# Patient Record
Sex: Male | Born: 1975 | Hispanic: Yes | Marital: Single | Smoking: Never smoker
Health system: Southern US, Community
[De-identification: ages and names within clinical notes are randomized; demographics above are authoritative.]

## PROBLEM LIST (undated history)

## (undated) DIAGNOSIS — R519 Headache, unspecified: Secondary | ICD-10-CM

## (undated) DIAGNOSIS — J449 Chronic obstructive pulmonary disease, unspecified: Secondary | ICD-10-CM

## (undated) DIAGNOSIS — D649 Anemia, unspecified: Secondary | ICD-10-CM

## (undated) DIAGNOSIS — T8859XA Other complications of anesthesia, initial encounter: Secondary | ICD-10-CM

## (undated) DIAGNOSIS — R0600 Dyspnea, unspecified: Secondary | ICD-10-CM

## (undated) DIAGNOSIS — I493 Ventricular premature depolarization: Secondary | ICD-10-CM

## (undated) DIAGNOSIS — J45909 Unspecified asthma, uncomplicated: Secondary | ICD-10-CM

## (undated) DIAGNOSIS — M1990 Unspecified osteoarthritis, unspecified site: Secondary | ICD-10-CM

## (undated) DIAGNOSIS — I499 Cardiac arrhythmia, unspecified: Secondary | ICD-10-CM

## (undated) DIAGNOSIS — I10 Essential (primary) hypertension: Secondary | ICD-10-CM

## (undated) HISTORY — DX: Cardiac arrhythmia, unspecified: I49.9

## (undated) HISTORY — PX: WISDOM TOOTH EXTRACTION: SHX21

## (undated) HISTORY — PX: ABLATION: SHX5711

## (undated) HISTORY — PX: SHOULDER SURGERY: SHX246

---

## 2018-07-11 ENCOUNTER — Other Ambulatory Visit: Payer: Self-pay

## 2018-07-11 ENCOUNTER — Emergency Department: Payer: BLUE CROSS/BLUE SHIELD

## 2018-07-11 ENCOUNTER — Emergency Department
Admission: EM | Admit: 2018-07-11 | Discharge: 2018-07-11 | Disposition: A | Discharge status: Home / Self Care | Payer: BLUE CROSS/BLUE SHIELD | Attending: Emergency Medicine | Admitting: Emergency Medicine

## 2018-07-11 DIAGNOSIS — W228XXA Striking against or struck by other objects, initial encounter: Secondary | ICD-10-CM | POA: Insufficient documentation

## 2018-07-11 DIAGNOSIS — S0990XA Unspecified injury of head, initial encounter: Secondary | ICD-10-CM | POA: Diagnosis present

## 2018-07-11 DIAGNOSIS — Y92009 Unspecified place in unspecified non-institutional (private) residence as the place of occurrence of the external cause: Secondary | ICD-10-CM | POA: Diagnosis not present

## 2018-07-11 DIAGNOSIS — Z79899 Other long term (current) drug therapy: Secondary | ICD-10-CM | POA: Insufficient documentation

## 2018-07-11 DIAGNOSIS — I10 Essential (primary) hypertension: Secondary | ICD-10-CM | POA: Insufficient documentation

## 2018-07-11 DIAGNOSIS — Y9389 Activity, other specified: Secondary | ICD-10-CM | POA: Diagnosis not present

## 2018-07-11 DIAGNOSIS — Y998 Other external cause status: Secondary | ICD-10-CM | POA: Insufficient documentation

## 2018-07-11 DIAGNOSIS — S0003XA Contusion of scalp, initial encounter: Secondary | ICD-10-CM

## 2018-07-11 DIAGNOSIS — S0103XA Puncture wound without foreign body of scalp, initial encounter: Secondary | ICD-10-CM | POA: Insufficient documentation

## 2018-07-11 HISTORY — DX: Essential (primary) hypertension: I10

## 2018-07-11 MED ORDER — CEPHALEXIN 500 MG PO CAPS: 500.0000 mg | ORAL_CAPSULE | Freq: Three times a day (TID) | ORAL | 0 refills | Status: DC

## 2018-07-11 MED ORDER — ETODOLAC 400 MG PO TABS: 400.0000 mg | ORAL_TABLET | Freq: Two times a day (BID) | ORAL | 0 refills | Status: DC

## 2018-07-11 NOTE — ED Provider Notes (Signed)
[facility] Emergency Department Provider Note  ____________________________________________   First MD Initiated Contact with Patient 07/11/18 1313     (approximate)  I have reviewed the triage vital signs and the nursing notes.   HISTORY  Chief Complaint Head Injury   HPI Steven Hughes is a 42 y.o. male presents to the emergency department with complaint of left-sided head pain, left neck and jaw pain.  Patient states that 2 weeks ago he hit his head on 2 wooden headers while working at his home.  He denies any loss of consciousness, nausea or vomiting.  He states that 1 of the Hatters had a nail that was sticking out which punctured his scalp.  He is concerned that this is not healed and is still tender to touch.  He is unaware of any drainage from the area.  He also states that his last tetanus was April 2019.  Patient denies any visual changes.  He takes Aleve infrequently for his head pain.  He rates his pain as 6 out of 10.   Past Medical History:  Diagnosis Date  . Hypertension     There are no active problems to display for this patient.   History reviewed. No pertinent surgical history.  Prior to Admission medications   Medication Sig Start Date End Date Taking? Authorizing Provider  metoprolol tartrate (LOPRESSOR) 25 MG tablet Take 25 mg by mouth 2 (two) times daily.   Yes [provider]  tadalafil (CIALIS) 5 MG tablet Take 5 mg by mouth daily as needed for erectile dysfunction.   Yes [provider]  cephALEXin (KEFLEX) 500 MG capsule Take 1 capsule (500 mg total) by mouth 3 (three) times daily. 07/11/18   Tommi Rumps, PA-C  etodolac (LODINE) 400 MG tablet Take 1 tablet (400 mg total) by mouth 2 (two) times daily. 07/11/18   Tommi Rumps, PA-C    Allergies Cucumber extract; Gluten meal; Morphine and related; Other; Strawberry (diagnostic); and Watermelon [citrullus vulgaris]  No family history on  file.  Social History Social History   Tobacco Use  . Smoking status: Never Smoker  Substance Use Topics  . Alcohol use: Not Currently  . Drug use: Not on file    Review of Systems Constitutional: No fever/chills Eyes: No visual changes. ENT: No sore throat.  Negative for dental pain.  Positive for left sided jaw pain. Cardiovascular: Denies chest pain. Respiratory: Denies shortness of breath. Gastrointestinal: No abdominal pain.  No nausea, no vomiting.  Musculoskeletal: Negative for back pain.  Positive for left-sided cervical muscle pain. Skin: Negative for rash.  Positive for puncture wound left scalp. Neurological: Negative for headaches, focal weakness or numbness. ____________________________________________   PHYSICAL EXAM:  VITAL SIGNS: ED Triage Vitals  Enc Vitals Group     BP 07/11/18 1138 128/81     Pulse Rate 07/11/18 1138 69     Resp 07/11/18 1138 18     Temp 07/11/18 1138 98.2 F (36.8 C)     Temp Source 07/11/18 1138 Oral     SpO2 07/11/18 1138 100 %     Weight 07/11/18 1139 163 lb (73.9 kg)     Height 07/11/18 1139 5\' 10"  (1.778 m)     Head Circumference --      Peak Flow --      Pain Score 07/11/18 1139 6     Pain Loc --      Pain Edu? --      Excl. in  GC? --    Constitutional: Alert and oriented. Well appearing and in no acute distress. Eyes: Conjunctivae are normal. PERRL. EOMI. Head: Atraumatic. Nose: No trauma. Mouth/Throat: No gross deformity and no soft tissue injury is noted to the left jaw.  There is some minimal point tenderness on palpation.  Teeth fit normally and no dental injury noted. Neck: No stridor.  No tenderness on palpation of cervical spine posteriorly.  There is some minimal soft tissue cervical muscle tenderness to palpation on the left lateral aspect.  Range of motion is without restriction. Hematological/Lymphatic/Immunilogical: No cervical lymphadenopathy. Cardiovascular: Normal rate, regular rhythm. Grossly normal heart  sounds.  Good peripheral circulation. Respiratory: Normal respiratory effort.  No retractions. Lungs CTAB. Gastrointestinal: Soft and nontender. No distention. Musculoskeletal: Moves upper and lower extremities that any difficulty.  Normal gait was noted. Neurologic:  Normal speech and language. No gross focal neurologic deficits are appreciated. Skin:  Skin is warm, dry.  There is a single puncture wound on the left lateral scalp without active bleeding.  Area is tender to touch.  No drainage is noted. Psychiatric: Mood and affect are normal. Speech and behavior are normal.  ____________________________________________   LABS (all labs ordered are listed, but only abnormal results are displayed)  Labs Reviewed - No data to display  RADIOLOGY  Official radiology report(s): Ct Head Wo Contrast  Result Date: 07/11/2018 CLINICAL DATA:  Struck in head 2 weeks ago. Persistent headache, left jaw and face pain. EXAM: CT HEAD WITHOUT CONTRAST CT MAXILLOFACIAL WITHOUT CONTRAST TECHNIQUE: Multidetector CT imaging of the head and maxillofacial structures were performed using the standard protocol without intravenous contrast. Multiplanar CT image reconstructions of the maxillofacial structures were also generated. COMPARISON:  None. FINDINGS: CT HEAD FINDINGS Brain: The ventricles are normal in size and configuration. No extra-axial fluid collections are identified. The gray-white differentiation is maintained. No CT findings for acute hemispheric infarction or intracranial hemorrhage. No mass lesions. The brainstem and cerebellum are normal. Vascular: No hyperdense vessels or obvious aneurysm. Skull: No acute skull fracture.  No bone lesion. Sinuses/Orbits: The paranasal sinuses and mastoid air cells are clear. The globes are intact. Other: No scalp lesions, laceration or hematoma. CT MAXILLOFACIAL FINDINGS Osseous: No fracture or mandibular dislocation. No destructive process. Orbits: Negative. No  traumatic or inflammatory finding. Sinuses: No acute sinus disease. Minimal scattered areas of mucoperiosteal thickening and a few small scattered polyps or mucous retention cyst. Soft tissues: No hematoma, laceration or foreign body. IMPRESSION: 1. Normal head CT. No acute intracranial findings or skull fracture. 2. No acute facial bone fractures. Electronically Signed   By: Rudie MeyerP.  Gallerani M.D.   On: 07/11/2018 12:28   Ct Maxillofacial Wo Contrast  Result Date: 07/11/2018 CLINICAL DATA:  Struck in head 2 weeks ago. Persistent headache, left jaw and face pain. EXAM: CT HEAD WITHOUT CONTRAST CT MAXILLOFACIAL WITHOUT CONTRAST TECHNIQUE: Multidetector CT imaging of the head and maxillofacial structures were performed using the standard protocol without intravenous contrast. Multiplanar CT image reconstructions of the maxillofacial structures were also generated. COMPARISON:  None. FINDINGS: CT HEAD FINDINGS Brain: The ventricles are normal in size and configuration. No extra-axial fluid collections are identified. The gray-white differentiation is maintained. No CT findings for acute hemispheric infarction or intracranial hemorrhage. No mass lesions. The brainstem and cerebellum are normal. Vascular: No hyperdense vessels or obvious aneurysm. Skull: No acute skull fracture.  No bone lesion. Sinuses/Orbits: The paranasal sinuses and mastoid air cells are clear. The globes are intact. Other: No scalp  lesions, laceration or hematoma. CT MAXILLOFACIAL FINDINGS Osseous: No fracture or mandibular dislocation. No destructive process. Orbits: Negative. No traumatic or inflammatory finding. Sinuses: No acute sinus disease. Minimal scattered areas of mucoperiosteal thickening and a few small scattered polyps or mucous retention cyst. Soft tissues: No hematoma, laceration or foreign body. IMPRESSION: 1. Normal head CT. No acute intracranial findings or skull fracture. 2. No acute facial bone fractures. Electronically Signed    By: Rudie Meyer M.D.   On: 07/11/2018 12:28   ____________________________________________   PROCEDURES  Procedure(s) performed: None  Procedures  Critical Care performed: No  ____________________________________________   INITIAL IMPRESSION / ASSESSMENT AND PLAN / ED COURSE  As part of my medical decision making, I reviewed the following data within the electronic MEDICAL RECORD NUMBER Notes from prior ED visits and [place] Controlled Substance Database  Patient presents to the ED with complaint of puncture wound to his left scalp after he hit a wooden head or with his head approximately 2 weeks ago.  Patient states he is up-to-date on tetanus.  He has continued to have headache, left facial pain and cervical muscle pain.  Patient has infrequently taken Aleve with out relief of his pain.  Patient is unaware of any drainage from his puncture wound.  Physical exam is consistent with a cervical muscle strain along with contusion of his scalp with a single puncture wound.  CT scan of head and maxillofacial bones were negative and reassuring to the patient.  He will begin taking Keflex 500 mg 3 times daily and etodolac 400 mg twice daily with food.  He is to follow-up with his PCP or [facility] acute care if any continued problems.  He is encouraged to clean the puncture wound daily with mild soap and water and to return if any severe worsening of his symptoms.  ____________________________________________   FINAL CLINICAL IMPRESSION(S) / ED DIAGNOSES  Final diagnoses:  Contusion of scalp, initial encounter  Puncture wound of scalp without foreign body, initial encounter     ED Discharge Orders         Ordered    cephALEXin (KEFLEX) 500 MG capsule  3 times daily     07/11/18 1412    etodolac (LODINE) 400 MG tablet  2 times daily     07/11/18 1412           Note:  This document was prepared using Dragon voice recognition software and may include unintentional dictation  errors.    Tommi Rumps, PA-C 07/11/18 1442    Governor Rooks, MD 07/11/18 [phone]

## 2018-07-11 NOTE — ED Notes (Signed)
See triage note  Presents with head pain  States he had a wooden header fall on  his head about 2 weeks ago  States he board had a nail in it  conts to have pain to area

## 2018-07-11 NOTE — ED Triage Notes (Addendum)
Pt states that he was hit in head with two wooden headers approx 2 week ago while working at his home. States that he has had continued pain to left side of head, left neck muscle,  left jaw and left side of face since. Does report LOC and puncture wound in head from nail. tetanus shot last received in April.  Pt alert and oriented X4, active, cooperative, pt in NAD. RR even and unlabored, color WNL.  No fevers at home reported.

## 2018-07-11 NOTE — Discharge Instructions (Signed)
Follow-up with your primary care provider or [facility] acute care if any continued problems.  Clean area with mild soap and water daily.  You may also put warm compresses to the area.  Begin taking etodolac 400 mg twice daily with food and Keflex 500 mg 3 times daily for 7 days.  You may also take Tylenol with this medication if needed for pain.

## 2020-01-28 ENCOUNTER — Other Ambulatory Visit: Payer: Self-pay | Admitting: Gastroenterology

## 2020-01-28 DIAGNOSIS — R1011 Right upper quadrant pain: Secondary | ICD-10-CM

## 2020-02-07 ENCOUNTER — Encounter
Admission: RE | Admit: 2020-02-07 | Discharge: 2020-02-07 | Disposition: A | Discharge status: Home / Self Care | Payer: BC Managed Care – PPO | Source: Ambulatory Visit | Attending: Gastroenterology | Admitting: Gastroenterology

## 2020-02-07 ENCOUNTER — Other Ambulatory Visit: Payer: Self-pay

## 2020-02-07 DIAGNOSIS — R1011 Right upper quadrant pain: Secondary | ICD-10-CM

## 2020-02-07 MED ORDER — TECHNETIUM TC 99M MEBROFENIN IV KIT
5.2870 | PACK | Freq: Once | INTRAVENOUS | Status: AC | PRN
  Administered 2020-02-07: 08:00:00 5.287 via INTRAVENOUS

## 2020-06-18 ENCOUNTER — Encounter: Payer: Self-pay | Admitting: Internal Medicine

## 2020-06-26 ENCOUNTER — Ambulatory Visit (INDEPENDENT_AMBULATORY_CARE_PROVIDER_SITE_OTHER): Payer: BC Managed Care – PPO | Admitting: Surgery

## 2020-06-26 ENCOUNTER — Encounter: Payer: Self-pay | Admitting: Surgery

## 2020-06-26 ENCOUNTER — Other Ambulatory Visit: Payer: Self-pay

## 2020-06-26 VITALS — BP 131/86 | HR 66 | Temp 98.3°F | Ht 70.0 in | Wt 196.0 lb

## 2020-06-26 DIAGNOSIS — R1011 Right upper quadrant pain: Secondary | ICD-10-CM | POA: Diagnosis not present

## 2020-06-26 NOTE — H&P (View-Only) (Signed)
Patient ID: Steven Hughes, male   DOB: 10/24/1976, 43 y.o.   MRN: [id number]  Chief Complaint: Right upper quadrant pain.  History of Present Illness Steven Hughes is a 43 y.o. male with right upper quadrant pain with radiation to right scapula.  Exacerbated postprandial, but also exacerbated with far less like water.  Typically short lived.  History of heartburn.  Denies constipation moving 1-3 times per day.  Had prior work-up which included an ultrasound in April of this year which was negative, HIDA scan previously showed an ejection fraction 98% and based on the patient's report did not provoke any of the symptoms he presents for.  He has a diastases recti and has some rectus strain/pain with activity/weight lifting.  He does have a small umbilical hernia he knows about but does not create/cause significant pain for him however he notes it is tender.  Past Medical History Past Medical History:  Diagnosis Date  . Arrhythmia   . Hypertension       Past Surgical History:  Procedure Laterality Date  . ABLATION    . SHOULDER SURGERY Bilateral     Allergies  Allergen Reactions  . Cucumber Extract   . Gluten Meal   . Morphine And Related   . Other     Cilantro  . Strawberry (Diagnostic)   . Strawberry Extract Other (See Comments)  . Watermelon [Citrullus Vulgaris]     Current Outpatient Medications  Medication Sig Dispense Refill  . albuterol (VENTOLIN HFA) 108 (90 Base) MCG/ACT inhaler     . fluticasone (FLONASE) 50 MCG/ACT nasal spray     . hydrOXYzine (VISTARIL) 25 MG capsule     . metoprolol tartrate (LOPRESSOR) 25 MG tablet Take 25 mg by mouth 2 (two) times daily.    . Tadalafil 2.5 MG TABS Take 1 tablet by mouth daily.    . cephALEXin (KEFLEX) 500 MG capsule Take 1 capsule (500 mg total) by mouth 3 (three) times daily. (Patient not taking: Reported on 06/26/2020) 21 capsule 0  . etodolac (LODINE) 400 MG tablet Take 1 tablet (400 mg total) by mouth 2 (two) times daily.  (Patient not taking: Reported on 06/26/2020) 14 tablet 0  . pantoprazole (PROTONIX) 40 MG tablet Take by mouth. (Patient not taking: Reported on 06/26/2020)    . sucralfate (CARAFATE) 1 g tablet TAKE 1 TABLET (1 G TOTAL) BY MOUTH 2 (TWO) TIMES DAILY BEFORE A MEAL (Patient not taking: Reported on 06/26/2020)    . tadalafil (CIALIS) 5 MG tablet Take 5 mg by mouth daily as needed for erectile dysfunction. (Patient not taking: Reported on 06/26/2020)     No current facility-administered medications for this visit.    Family History Family History  Problem Relation Age of Onset  . Hepatitis C Mother   . Healthy Mother   . Parkinson's disease Father   . Healthy Father   . Hypertension Sister   . Hypertension Paternal Grandmother   . Hypertension Paternal Grandfather       Social History Social History   Tobacco Use  . Smoking status: Never Smoker  . Smokeless tobacco: Never Used  Substance Use Topics  . Alcohol use: Not Currently  . Drug use: Not on file        Review of Systems  Constitutional: Negative for chills, diaphoresis, fever, malaise/fatigue and weight loss.  HENT: Negative.   Eyes: Positive for blurred vision.  Respiratory: Positive for shortness of breath.   Cardiovascular: Positive for chest pain and palpitations.    Gastrointestinal: Positive for abdominal pain and heartburn. Negative for blood in stool, constipation, diarrhea, melena, nausea and vomiting.  Genitourinary: Positive for dysuria and frequency.  Skin: Negative.   Neurological: Positive for headaches.  Psychiatric/Behavioral: Negative.       Physical Exam Blood pressure 131/86, pulse 66, temperature 98.3 F (36.8 C), temperature source Oral, height 5\' 10"  (1.778 m), weight 196 lb (88.9 kg), SpO2 96 %. Last Weight  Most recent update: 06/26/2020  9:36 AM   Weight  88.9 kg (196 lb)            CONSTITUTIONAL: Well developed, and nourished, appropriately responsive and aware without distress.   EYES:  Sclera non-icteric.   EARS, NOSE, MOUTH AND THROAT: Mask worn.   Hearing is intact to voice.  NECK: Trachea is midline, and there is no jugular venous distension.  LYMPH NODES:  Lymph nodes in the neck are not enlarged. RESPIRATORY:  Lungs are clear, and breath sounds are equal bilaterally. Normal respiratory effort without pathologic use of accessory muscles. CARDIOVASCULAR: Heart is regular in rate and rhythm. GI: The abdomen is soft, nontender, and nondistended. There were no palpable masses. I did not appreciate hepatosplenomegaly. There were normal bowel sounds.  He has mild to moderate diastases recti, with a subcentimeter incarcerated umbilical hernia which is tender with attempts to reduce. MUSCULOSKELETAL:  Symmetrical muscle tone appreciated in all four extremities.    SKIN: Skin turgor is normal. No pathologic skin lesions appreciated.  NEUROLOGIC:  Motor and sensation appear grossly normal.  Cranial nerves are grossly without defect. PSYCH:  Alert and oriented to person, place and time. Affect is appropriate for situation.  Data Reviewed I have personally reviewed what is currently available of the patient's imaging, recent labs and medical records.   Labs:  No flowsheet data found. No flowsheet data found.    Imaging: Radiology review: Reviewed the reports from prior abdominal ultrasound and HIDA scan. Within last 24 hrs: No results found.  Assessment    Right upper quadrant pain, doubt cholecystectomy will be helpful despite a classical presentation without fatty food intolerance.  And in light of his evaluation thus far has been negative.  He is already seen GI and upper endoscopy apparently has revealed nothing.  It is clearly not costochondritis. There are no problems to display for this patient.   Plan    I have offered a CT scan evaluation to go a little further.  He is not interested in any colonoscopy to evaluate potential colonic source.  Offered repair of the  small umbilical hernia, which we will proceed with after the CT scan shows no other explanation for his pain. Risk of the umbilical hernia repair discussed with the patient which include but are not limited to anesthesia, bleeding, infection, recurrence.  I made it very clear that this will not specifically address his presenting complaint.  I have had a rare patient in my history that has reportedly improved with cholecystectomy in this scenario, at present I do not believe I am justified at pursuing cholecystectomy for him.  I believe he understands this and desires to follow through with a CT and proceed to have his umbilical defect repaired.  Face-to-face time spent with the patient and accompanying care providers(if present) was 45 minutes, with more than 50% of the time spent counseling, educating, and coordinating care of the patient.      08/26/2020 M.D., FACS 06/26/2020, 3:38 PM

## 2020-06-26 NOTE — Patient Instructions (Addendum)
Patient will have CT Abdomen Pelvis with Contrast at [facility] on 07/09/20.  Patient will arrive at 3:15p for 3:30p scan. Patient instructions: patient will pick up the prep kit.  Patient is NOT to have anything to eat or drink 4 hours prior to CT scan.  Abdominal Pain, Adult Many things can cause belly (abdominal) pain. Most times, belly pain is not dangerous. Many cases of belly pain can be watched and treated at home. Sometimes, though, belly pain is serious. Your doctor will try to find the cause of your belly pain. Follow these instructions at home:  Medicines  Take over-the-counter and prescription medicines only as told by your doctor.  Do not take medicines that help you poop (laxatives) unless told by your doctor. General instructions  Watch your belly pain for any changes.  Drink enough fluid to keep your pee (urine) pale yellow.  Keep all follow-up visits as told by your doctor. This is important. Contact a doctor if:  Your belly pain changes or gets worse.  You are not hungry, or you lose weight without trying.  You are having trouble pooping (constipated) or have watery poop (diarrhea) for more than 2-3 days.  You have pain when you pee or poop.  Your belly pain wakes you up at night.  Your pain gets worse with meals, after eating, or with certain foods.  You are vomiting and cannot keep anything down.  You have a fever.  You have blood in your pee. Get help right away if:  Your pain does not go away as soon as your doctor says it should.  You cannot stop vomiting.  Your pain is only in areas of your belly, such as the right side or the left lower part of the belly.  You have bloody or black poop, or poop that looks like tar.  You have very bad pain, cramping, or bloating in your belly.  You have signs of not having enough fluid or water in your body (dehydration), such as: ? Dark pee, very little pee, or no pee. ? Cracked  lips. ? Dry mouth. ? Sunken eyes. ? Sleepiness. ? Weakness.  You have trouble breathing or chest pain. Summary  Many cases of belly pain can be watched and treated at home.  Watch your belly pain for any changes.  Take over-the-counter and prescription medicines only as told by your doctor.  Contact a doctor if your belly pain changes or gets worse.  Get help right away if you have very bad pain, cramping, or bloating in your belly. This information is not intended to replace advice given to you by your health care provider. Make sure you discuss any questions you have with your health care provider. Document Revised: 02/19/2019 Document Reviewed: 02/19/2019 Elsevier Patient Education  [place].

## 2020-06-26 NOTE — Progress Notes (Signed)
Patient ID: Steven Hughes, male   DOB: 19-Mar-1976, 44 y.o.   MRN: [id number]  Chief Complaint: Right upper quadrant pain.  History of Present Illness Steven Hughes is a 44 y.o. male with right upper quadrant pain with radiation to right scapula.  Exacerbated postprandial, but also exacerbated with far less like water.  Typically short lived.  History of heartburn.  Denies constipation moving 1-3 times per day.  Had prior work-up which included an ultrasound in April of this year which was negative, HIDA scan previously showed an ejection fraction 98% and based on the patient's report did not provoke any of the symptoms he presents for.  He has a diastases recti and has some rectus strain/pain with activity/weight lifting.  He does have a small umbilical hernia he knows about but does not create/cause significant pain for him however he notes it is tender.  Past Medical History Past Medical History:  Diagnosis Date  . Arrhythmia   . Hypertension       Past Surgical History:  Procedure Laterality Date  . ABLATION    . SHOULDER SURGERY Bilateral     Allergies  Allergen Reactions  . Cucumber Extract   . Gluten Meal   . Morphine And Related   . Other     Cilantro  . Strawberry (Diagnostic)   . Strawberry Extract Other (See Comments)  . Watermelon [Citrullus Vulgaris]     Current Outpatient Medications  Medication Sig Dispense Refill  . albuterol (VENTOLIN HFA) 108 (90 Base) MCG/ACT inhaler     . fluticasone (FLONASE) 50 MCG/ACT nasal spray     . hydrOXYzine (VISTARIL) 25 MG capsule     . metoprolol tartrate (LOPRESSOR) 25 MG tablet Take 25 mg by mouth 2 (two) times daily.    . Tadalafil 2.5 MG TABS Take 1 tablet by mouth daily.    . cephALEXin (KEFLEX) 500 MG capsule Take 1 capsule (500 mg total) by mouth 3 (three) times daily. (Patient not taking: Reported on 06/26/2020) 21 capsule 0  . etodolac (LODINE) 400 MG tablet Take 1 tablet (400 mg total) by mouth 2 (two) times daily.  (Patient not taking: Reported on 06/26/2020) 14 tablet 0  . pantoprazole (PROTONIX) 40 MG tablet Take by mouth. (Patient not taking: Reported on 06/26/2020)    . sucralfate (CARAFATE) 1 g tablet TAKE 1 TABLET (1 G TOTAL) BY MOUTH 2 (TWO) TIMES DAILY BEFORE A MEAL (Patient not taking: Reported on 06/26/2020)    . tadalafil (CIALIS) 5 MG tablet Take 5 mg by mouth daily as needed for erectile dysfunction. (Patient not taking: Reported on 06/26/2020)     No current facility-administered medications for this visit.    Family History Family History  Problem Relation Age of Onset  . Hepatitis C Mother   . Healthy Mother   . Parkinson's disease Father   . Healthy Father   . Hypertension Sister   . Hypertension Paternal Grandmother   . Hypertension Paternal Grandfather       Social History Social History   Tobacco Use  . Smoking status: Never Smoker  . Smokeless tobacco: Never Used  Substance Use Topics  . Alcohol use: Not Currently  . Drug use: Not on file        Review of Systems  Constitutional: Negative for chills, diaphoresis, fever, malaise/fatigue and weight loss.  HENT: Negative.   Eyes: Positive for blurred vision.  Respiratory: Positive for shortness of breath.   Cardiovascular: Positive for chest pain and palpitations.  Gastrointestinal: Positive for abdominal pain and heartburn. Negative for blood in stool, constipation, diarrhea, melena, nausea and vomiting.  Genitourinary: Positive for dysuria and frequency.  Skin: Negative.   Neurological: Positive for headaches.  Psychiatric/Behavioral: Negative.       Physical Exam Blood pressure 131/86, pulse 66, temperature 98.3 F (36.8 C), temperature source Oral, height 5\' 10"  (1.778 m), weight 196 lb (88.9 kg), SpO2 96 %. Last Weight  Most recent update: 06/26/2020  9:36 AM   Weight  88.9 kg (196 lb)            CONSTITUTIONAL: Well developed, and nourished, appropriately responsive and aware without distress.   EYES:  Sclera non-icteric.   EARS, NOSE, MOUTH AND THROAT: Mask worn.   Hearing is intact to voice.  NECK: Trachea is midline, and there is no jugular venous distension.  LYMPH NODES:  Lymph nodes in the neck are not enlarged. RESPIRATORY:  Lungs are clear, and breath sounds are equal bilaterally. Normal respiratory effort without pathologic use of accessory muscles. CARDIOVASCULAR: Heart is regular in rate and rhythm. GI: The abdomen is soft, nontender, and nondistended. There were no palpable masses. I did not appreciate hepatosplenomegaly. There were normal bowel sounds.  He has mild to moderate diastases recti, with a subcentimeter incarcerated umbilical hernia which is tender with attempts to reduce. MUSCULOSKELETAL:  Symmetrical muscle tone appreciated in all four extremities.    SKIN: Skin turgor is normal. No pathologic skin lesions appreciated.  NEUROLOGIC:  Motor and sensation appear grossly normal.  Cranial nerves are grossly without defect. PSYCH:  Alert and oriented to person, place and time. Affect is appropriate for situation.  Data Reviewed I have personally reviewed what is currently available of the patient's imaging, recent labs and medical records.   Labs:  No flowsheet data found. No flowsheet data found.    Imaging: Radiology review: Reviewed the reports from prior abdominal ultrasound and HIDA scan. Within last 24 hrs: No results found.  Assessment    Right upper quadrant pain, doubt cholecystectomy will be helpful despite a classical presentation without fatty food intolerance.  And in light of his evaluation thus far has been negative.  He is already seen GI and upper endoscopy apparently has revealed nothing.  It is clearly not costochondritis. There are no problems to display for this patient.   Plan    I have offered a CT scan evaluation to go a little further.  He is not interested in any colonoscopy to evaluate potential colonic source.  Offered repair of the  small umbilical hernia, which we will proceed with after the CT scan shows no other explanation for his pain. Risk of the umbilical hernia repair discussed with the patient which include but are not limited to anesthesia, bleeding, infection, recurrence.  I made it very clear that this will not specifically address his presenting complaint.  I have had a rare patient in my history that has reportedly improved with cholecystectomy in this scenario, at present I do not believe I am justified at pursuing cholecystectomy for him.  I believe he understands this and desires to follow through with a CT and proceed to have his umbilical defect repaired.  Face-to-face time spent with the patient and accompanying care providers(if present) was 45 minutes, with more than 50% of the time spent counseling, educating, and coordinating care of the patient.      08/26/2020 M.D., FACS 06/26/2020, 3:38 PM

## 2020-06-27 ENCOUNTER — Telehealth: Payer: Self-pay | Admitting: Surgery

## 2020-06-27 NOTE — Telephone Encounter (Signed)
Pt has been advised of Pre-Admission date/time, COVID Testing date and Surgery date, also mailed.  Surgery Date: 07/18/20 Preadmission Testing Date: 07/04/20 (phone 1p-5p) Covid Testing Date: 07/16/20 - patient [facility] to [facility] ([address] [facility]) between 8a-1p   Patient has been made aware to call [phone], between 1-3:00pm the day before surgery, to find out what time to arrive for surgery.

## 2020-07-03 ENCOUNTER — Ambulatory Visit: Payer: Self-pay | Admitting: Surgery

## 2020-07-03 DIAGNOSIS — K429 Umbilical hernia without obstruction or gangrene: Secondary | ICD-10-CM

## 2020-07-04 ENCOUNTER — Encounter
Admission: RE | Admit: 2020-07-04 | Discharge: 2020-07-04 | Disposition: A | Discharge status: Home / Self Care | Payer: BC Managed Care – PPO | Source: Ambulatory Visit | Attending: Surgery | Admitting: Surgery

## 2020-07-04 ENCOUNTER — Other Ambulatory Visit: Payer: Self-pay

## 2020-07-04 DIAGNOSIS — Z01818 Encounter for other preprocedural examination: Secondary | ICD-10-CM | POA: Diagnosis not present

## 2020-07-04 HISTORY — DX: Ventricular premature depolarization: I49.3

## 2020-07-04 HISTORY — DX: Unspecified osteoarthritis, unspecified site: M19.90

## 2020-07-04 HISTORY — DX: Anemia, unspecified: D64.9

## 2020-07-04 HISTORY — DX: Dyspnea, unspecified: R06.00

## 2020-07-04 HISTORY — DX: Unspecified asthma, uncomplicated: J45.909

## 2020-07-04 HISTORY — DX: Other complications of anesthesia, initial encounter: T88.59XA

## 2020-07-04 HISTORY — DX: Headache, unspecified: R51.9

## 2020-07-04 HISTORY — DX: Chronic obstructive pulmonary disease, unspecified: J44.9

## 2020-07-04 NOTE — Pre-Procedure Instructions (Signed)
ECG 12-lead  Component 6 mo ago  Vent Rate (bpm)  74     PR Interval (msec)  146     QRS Interval (msec)  100     QT Interval (msec)  360     QTc (msec)  399     Narrative  Normal sinus rhythm  Normal ECG  When compared with ECG of 14-Dec-2019 11:52,  No significant change was found  I reviewed and concur with this report. Electronically signed [email or web address], MD, Christen Bame [phone]) on 01/23/2020 7:08:27 PM Other Result Text  This result has an attachment that is not available. Specimen Collected: --  Date: 12/14/19  Received From: [facility]   Encounter Summary

## 2020-07-04 NOTE — Patient Instructions (Signed)
Your procedure is scheduled on: 07-18-20 FRIDAY Report to Same Day Surgery 2nd floor medical mall [facility] Entrance-take elevator on left to 2nd floor.  Check in with surgery information desk.) To find out your arrival time please call [phone] between 1PM - 3PM on 07-17-20 THURSDAY  Remember: Instructions that are not followed completely may result in serious medical risk, up to and including death, or upon the discretion of your surgeon and anesthesiologist your surgery may need to be rescheduled.    _x___ 1. Do not eat food after midnight the night before your procedure. NO GUM OR CANDY AFTER MIDNIGHT. You may drink clear liquids up to 2 hours before you are scheduled to arrive at the hospital for your procedure.  Do not drink clear liquids within 2 hours of your scheduled arrival to the hospital.  Clear liquids include  --Water or Apple juice without pulp  -- Gatorade  --Black Coffee or Clear Tea (No milk, no creamers, do not add anything to the coffee or Tea-OK TO ADD SUGAR     __x__ 2. No Alcohol for 24 hours before or after surgery.   __x__3. No Smoking or e-cigarettes for 24 prior to surgery.  Do not use any chewable tobacco products for at least 6 hour prior to surgery   ____  4. Bring all medications with you on the day of surgery if instructed.    __x__ 5. Notify your doctor if there is any change in your medical condition     (cold, fever, infections).    x___6. On the morning of surgery brush your teeth with toothpaste and water.  You may rinse your mouth with mouth wash if you wish.  Do not swallow any toothpaste or mouthwash.   Do not wear jewelry, make-up, hairpins, clips or nail polish.  Do not wear lotions, powders, or perfumes.   Do not shave 48 hours prior to surgery. Men may shave face and neck.  Do not bring valuables to the hospital.    [facility] is not responsible for any belongings or valuables.               Contacts, dentures or bridgework may  not be worn into surgery.  Leave your suitcase in the car. After surgery it may be brought to your room.  For patients admitted to the hospital, discharge time is determined by your treatment team.  _  Patients discharged the day of surgery will not be allowed to drive home.  You will need someone to drive you home and stay with you the night of your procedure.    Please read over the following fact sheets that you were given:   [facility] Preparing for Surgery   ____ TAKE THE FOLLOWING MEDICATION THE MORNING OF SURGERY WITH A SMALL SIP OF WATER. These include:  1. NONE  2.  3.  4.  5.  6.  ____Fleets enema or Magnesium Citrate as directed.   _x___ Use CHG Soap as directed on instruction sheet   _X___ Use inhalers on the day of surgery and bring to hospital day of surgery-USE YOUR ALBUTEROL INHALER THE MORNING OF SURGERY AND BRING INHALER WITH YOU TO THE HOSPITAL  ____ Stop Metformin and Janumet 2 days prior to surgery.    ____ Take 1/2 of usual insulin dose the night before surgery and none on the morning surgery.   ____ Follow recommendations from Cardiologist, Pulmonologist or PCP regarding stopping Aspirin, Coumadin, Plavix ,Eliquis, Effient, or Pradaxa,  and Pletal.  X____Stop Anti-inflammatories such as Advil, Aleve, Ibuprofen, Motrin, Naproxen, Naprosyn, Goodies powders or aspirin products 7 DAYS PRIOR TO SURGERY-OK to take Tylenol    _x___ Stop supplements until after surgery-STOP MELATONIN 7 DAYS PRIOR TO SURGERY-OK TO RESUME AFTER SURGERY   ____ Bring C-Pap to the hospital.

## 2020-07-07 ENCOUNTER — Other Ambulatory Visit: Payer: BC Managed Care – PPO

## 2020-07-08 ENCOUNTER — Telehealth: Payer: Self-pay | Admitting: *Deleted

## 2020-07-08 ENCOUNTER — Inpatient Hospital Stay: Admission: RE | Admit: 2020-07-08 | Payer: BC Managed Care – PPO | Source: Ambulatory Visit

## 2020-07-08 NOTE — Telephone Encounter (Signed)
Patient called and wanted to get a doctors note in regards to when he can return to work and what restrictions. He wanted a nurse to call him first in regards to this.   He is having surgery on 07/18/20 with Dr Guadalupe Dawn hernia

## 2020-07-09 ENCOUNTER — Ambulatory Visit
Admission: RE | Admit: 2020-07-09 | Discharge: 2020-07-09 | Disposition: A | Discharge status: Home / Self Care | Payer: BC Managed Care – PPO | Source: Ambulatory Visit | Attending: Surgery | Admitting: Surgery

## 2020-07-09 ENCOUNTER — Encounter: Payer: Self-pay | Admitting: Emergency Medicine

## 2020-07-09 ENCOUNTER — Other Ambulatory Visit: Payer: Self-pay

## 2020-07-09 DIAGNOSIS — R1011 Right upper quadrant pain: Secondary | ICD-10-CM | POA: Diagnosis present

## 2020-07-09 LAB — POCT I-STAT CREATININE: Creatinine, Ser: 1.3 mg/dL — ABNORMAL HIGH (ref 0.61–1.24)

## 2020-07-09 MED ORDER — IOHEXOL 300 MG/ML  SOLN
100.0000 mL | Freq: Once | INTRAMUSCULAR | Status: AC | PRN
  Administered 2020-07-09: 100 mL via INTRAVENOUS

## 2020-07-09 NOTE — Telephone Encounter (Signed)
Called patient, states he is having surgery on 9/24. Patient needs work note for 2 weeks. So returning to work on 08/04/20. Advised pt he will have restrictions of no heavy lifting 10-15 pounds until 4-6 weeks. Pt states that is fine, he works from home so it shouldn't be an issue. Pt wants letter mailed to his home. Pt confirmed address. Advised pt letter is placed at the front ready to be mailed out. No further concerns.

## 2020-07-11 ENCOUNTER — Other Ambulatory Visit: Payer: Self-pay

## 2020-07-11 ENCOUNTER — Emergency Department
Admission: EM | Admit: 2020-07-11 | Discharge: 2020-07-11 | Disposition: A | Discharge status: Home / Self Care | Payer: No Typology Code available for payment source | Attending: Emergency Medicine | Admitting: Emergency Medicine

## 2020-07-11 DIAGNOSIS — S24109A Unspecified injury at unspecified level of thoracic spinal cord, initial encounter: Secondary | ICD-10-CM | POA: Diagnosis present

## 2020-07-11 DIAGNOSIS — J45909 Unspecified asthma, uncomplicated: Secondary | ICD-10-CM | POA: Diagnosis not present

## 2020-07-11 DIAGNOSIS — Z7951 Long term (current) use of inhaled steroids: Secondary | ICD-10-CM | POA: Diagnosis not present

## 2020-07-11 DIAGNOSIS — S29012A Strain of muscle and tendon of back wall of thorax, initial encounter: Secondary | ICD-10-CM

## 2020-07-11 DIAGNOSIS — X500XXA Overexertion from strenuous movement or load, initial encounter: Secondary | ICD-10-CM | POA: Insufficient documentation

## 2020-07-11 DIAGNOSIS — Z79899 Other long term (current) drug therapy: Secondary | ICD-10-CM | POA: Insufficient documentation

## 2020-07-11 MED ORDER — CYCLOBENZAPRINE HCL 10 MG PO TABS: 5.0000 mg | ORAL_TABLET | Freq: Three times a day (TID) | ORAL | 0 refills | Status: DC | PRN

## 2020-07-11 MED ORDER — MELOXICAM 15 MG PO TABS: 15.0000 mg | ORAL_TABLET | Freq: Every day | ORAL | 0 refills | Status: DC

## 2020-07-11 NOTE — ED Notes (Signed)
See triage note  Presents with pain between shoulder blades and into left arm  States developed pain while at work    States the pain took his breath   It was hard to breath   Ambulates well

## 2020-07-11 NOTE — Discharge Instructions (Signed)
Please follow up with primary care for symptoms not improving over the week.  Return to the ER for symptoms that change or worsen if unable to schedule an appointment.

## 2020-07-11 NOTE — ED Provider Notes (Signed)
[facility] Emergency Department Provider Note ____________________________________________  Time seen: Approximately 3:19 PM  I have reviewed the triage vital signs and the nursing notes.  HISTORY  Chief Complaint Back Pain and Shoulder Pain   HPI Steven Hughes is a 44 y.o. male who presents to the emergency department for treatment and evaluation of upper back pain after picking up something heavy while at work today. Pain is between his shoulder blades and initially was severe. Some relief after ibuprofen.  Past Medical History:  Diagnosis Date  . Anemia    ONLY AS A CHILD  . Arrhythmia   . Arthritis   . Asthma   . Complication of anesthesia    DURING DENTAL PROCEDURES NOVACAINE CAUSES MORE PVC'S   . COPD (chronic obstructive pulmonary disease) (HCC)    DEVELOPED THIS 5 YEARS AFTER BEING AT [facility] ON 9-11  . Dyspnea    PT STATES THIS WILL HAPPEN OCC WITH ENVIRONMENT (MOLD, DUST ETC) SINCE BEING AT [facility] ON 9-11-PT STATES HE CAN WALK 5 MILES WITHOUT GETTING SOB OR HAVING CP  . Headache    MIGRAINES  . Hypertension   . PVC (premature ventricular contraction)     Patient Active Problem List   Diagnosis Date Noted  . Right upper quadrant abdominal pain 06/26/2020    Past Surgical History:  Procedure Laterality Date  . ABLATION     HEART ABLATION X 2  . SHOULDER SURGERY Bilateral   . WISDOM TOOTH EXTRACTION      Prior to Admission medications   Medication Sig Start Date End Date Taking? Authorizing Provider  acetaminophen (TYLENOL) 500 MG tablet Take 1,000 mg by mouth every 6 (six) hours as needed.    [provider]  albuterol (VENTOLIN HFA) 108 (90 Base) MCG/ACT inhaler Inhale 2 puffs into the lungs every 4 (four) hours as needed for wheezing or shortness of breath.  09/27/19   [provider]  b complex vitamins tablet Take 1 tablet by mouth daily.    [provider]  cyclobenzaprine (FLEXERIL) 10 MG  tablet Take 0.5 tablets (5 mg total) by mouth 3 (three) times daily as needed for muscle spasms. 07/11/20   Jullianna Gabor, Rulon Eisenmenger B, FNP  fluticasone (FLONASE) 50 MCG/ACT nasal spray Place 2 sprays into both nostrils daily as needed for allergies.  09/27/19   [provider]  hydrOXYzine (VISTARIL) 25 MG capsule Take 25 mg by mouth at bedtime.  04/09/19   [provider]  ibuprofen (ADVIL) 200 MG tablet Take 400 mg by mouth every 6 (six) hours as needed.    [provider]  Melatonin 10 MG TABS Take 10 mg by mouth at bedtime.    [provider]  meloxicam (MOBIC) 15 MG tablet Take 1 tablet (15 mg total) by mouth daily. 07/11/20   Kazia Grisanti, Rulon Eisenmenger B, FNP  metoprolol tartrate (LOPRESSOR) 25 MG tablet Take 25 mg by mouth at bedtime.     [provider]  Tadalafil 2.5 MG TABS Take 2.5 mg by mouth daily.  02/07/20   [provider]    Allergies Caffeine, Cucumber extract, Gluten meal, Other, Strawberry (diagnostic), Strawberry extract, Watermelon [citrullus vulgaris], and Morphine and related  Family History  Problem Relation Age of Onset  . Hepatitis C Mother   . Healthy Mother   . Parkinson's disease Father   . Healthy Father   . Hypertension Sister   . Hypertension Paternal Grandmother   . Hypertension Paternal Grandfather  Social History Social History   Tobacco Use  . Smoking status: Never Smoker  . Smokeless tobacco: Never Used  Vaping Use  . Vaping Use: Never used  Substance Use Topics  . Alcohol use: Yes    Comment: OCC BEER ON WEEKENDS  . Drug use: Never    Review of Systems Constitutional: Well appearing. Respiratory: Negative for dyspnea. Cardiovascular: Negative for change in skin temperature or color. Musculoskeletal:   Negative for chronic steroid use   Negative for trauma in the presence of osteoporosis  Negative for age over 77 and trauma.  Negative for constitutional symptoms, or history of cancer  Negative for pain  worse at night. Skin: Negative for rash, lesion, or wound.  Genitourinary: Negative for urinary retention. Rectal: Negative for fecal incontinence or new onset constipation/bowel habit changes. Hematological/Immunilogical: Negative for immunosuppression, IV drug use, or fever Neurological: Negative for burning, tingling, numb, electric, radiating pain in the lower extremities.                        Negative for saddle anesthesia.                        Negative for focal neurologic deficit, progressive or disabling symptoms             Negative for saddle anesthesia. ____________________________________________   PHYSICAL EXAM:  VITAL SIGNS: ED Triage Vitals  Enc Vitals Group     BP 07/11/20 1348 (!) 149/103     Pulse Rate 07/11/20 1348 60     Resp 07/11/20 1348 18     Temp 07/11/20 1348 97.8 F (36.6 C)     Temp Source 07/11/20 1348 Oral     SpO2 07/11/20 1348 98 %     Weight 07/11/20 1349 185 lb (83.9 kg)     Height 07/11/20 1349 5\' 10"  (1.778 m)     Head Circumference --      Peak Flow --      Pain Score 07/11/20 1415 7     Pain Loc --      Pain Edu? --      Excl. in GC? --     Constitutional: Alert and oriented. Well appearing and in no acute distress. Eyes: Conjunctivae are clear without discharge or drainage.  Head: Atraumatic. Neck: Full, active range of motion. Respiratory: Respirations even and unlabored. Musculoskeletal: Focal tenderness of subscapular muscles on the left thoracic back.  Full ROM of the back and extremities, Strength 5/5 of the lower extremities as tested. Neurologic: Reflexes of the lower extremities are 2+.  Negative straight leg raise on the right and left side. Skin: Atraumatic.  Psychiatric: Behavior and affect are normal.  ____________________________________________   LABS (all labs ordered are listed, but only abnormal results are displayed)  Labs Reviewed - No data to  display ____________________________________________  RADIOLOGY  Not indicated ____________________________________________   PROCEDURES  Procedure(s) performed:  Procedures ____________________________________________   INITIAL IMPRESSION / ASSESSMENT AND PLAN / ED COURSE  Steven Hughes is a 44 y.o. male presenting to the emergency department for treatment and evaluation of back pain.  See HPI for further details.  No indication for imaging at this time, will treat with anti-inflammatory and muscle relaxer.  Patient was encouraged to use ice over the tender area for the next few days as well.  He is to follow-up with primary care or return to the emergency department for symptoms of concern.  Medications - No data to display  ED Discharge Orders         Ordered    meloxicam (MOBIC) 15 MG tablet  Daily        07/11/20 1534    cyclobenzaprine (FLEXERIL) 10 MG tablet  3 times daily PRN        07/11/20 1534           Pertinent labs & imaging results that were available during my care of the patient were reviewed by me and considered in my medical decision making (see chart for details).  _________________________________________   FINAL CLINICAL IMPRESSION(S) / ED DIAGNOSES  Final diagnoses:  Strain of thoracic back region     If controlled substance prescribed during this visit, 12 month history viewed on the [facility] prior to issuing an initial prescription for Schedule II or III opiod.   Chinita Pester, FNP 07/11/20 2237    Delton Prairie, MD 07/11/20 [phone]

## 2020-07-11 NOTE — ED Triage Notes (Signed)
Patient was picking something up at work today and felt immediate onset of pain in the back between his shoulder blades. Now states the pain is better after ibuprofen. States when it happened "I couldn't even walk." Patient ambulatory to the triage room without incident or need for assistance.

## 2020-07-14 ENCOUNTER — Telehealth: Payer: Self-pay | Admitting: Surgery

## 2020-07-14 NOTE — Telephone Encounter (Signed)
Per Dr.Pabon says to let patient know that the medications (Meloxicam or Cyclobenzaprine) he was given this past weekend in the ER for his back pain, should not interfere with his preparation prior to surgery. Patient was informed to give our office a call back if he has any concerns or questions. Patient verbalized understanding and has no further questions.

## 2020-07-14 NOTE — Telephone Encounter (Signed)
Patient is calling and said he is suppose to have surgery on Friday, but went into the ER for some back pain and was put on these two medications Meloxiam, cyclobenzaprine and is asking if he will need to stop these before surgery. Please call patient and advise.

## 2020-07-16 ENCOUNTER — Other Ambulatory Visit: Payer: Self-pay

## 2020-07-16 ENCOUNTER — Other Ambulatory Visit
Admission: RE | Admit: 2020-07-16 | Discharge: 2020-07-16 | Disposition: A | Discharge status: Home / Self Care | Payer: BC Managed Care – PPO | Source: Ambulatory Visit | Attending: Surgery | Admitting: Surgery

## 2020-07-16 DIAGNOSIS — I10 Essential (primary) hypertension: Secondary | ICD-10-CM | POA: Insufficient documentation

## 2020-07-16 DIAGNOSIS — Z01812 Encounter for preprocedural laboratory examination: Secondary | ICD-10-CM | POA: Diagnosis present

## 2020-07-16 DIAGNOSIS — I493 Ventricular premature depolarization: Secondary | ICD-10-CM | POA: Insufficient documentation

## 2020-07-16 DIAGNOSIS — Z20822 Contact with and (suspected) exposure to covid-19: Secondary | ICD-10-CM | POA: Insufficient documentation

## 2020-07-16 LAB — SARS CORONAVIRUS 2 (TAT 6-24 HRS): SARS Coronavirus 2: NEGATIVE

## 2020-07-16 NOTE — Pre-Procedure Instructions (Signed)
Attempted to reach patient via phone to remind him of his preop ekg and covid test.  Asked him to return this call to let us know of his plans for arrival to preadmit testing.

## 2020-07-18 ENCOUNTER — Ambulatory Visit
Admission: RE | Admit: 2020-07-18 | Discharge: 2020-07-18 | Disposition: A | Discharge status: Home / Self Care | Payer: BC Managed Care – PPO | Attending: Surgery | Admitting: Surgery

## 2020-07-18 ENCOUNTER — Ambulatory Visit: Payer: BC Managed Care – PPO | Admitting: Anesthesiology

## 2020-07-18 ENCOUNTER — Encounter: Payer: Self-pay | Admitting: Surgery

## 2020-07-18 ENCOUNTER — Other Ambulatory Visit: Payer: Self-pay

## 2020-07-18 ENCOUNTER — Encounter
Admission: RE | Disposition: A | Discharge status: Home / Self Care | Payer: Self-pay | Source: Home / Self Care | Attending: Surgery

## 2020-07-18 DIAGNOSIS — Z79899 Other long term (current) drug therapy: Secondary | ICD-10-CM | POA: Insufficient documentation

## 2020-07-18 DIAGNOSIS — I10 Essential (primary) hypertension: Secondary | ICD-10-CM | POA: Diagnosis not present

## 2020-07-18 DIAGNOSIS — K429 Umbilical hernia without obstruction or gangrene: Secondary | ICD-10-CM | POA: Diagnosis not present

## 2020-07-18 DIAGNOSIS — R1011 Right upper quadrant pain: Secondary | ICD-10-CM | POA: Diagnosis not present

## 2020-07-18 DIAGNOSIS — M6208 Separation of muscle (nontraumatic), other site: Secondary | ICD-10-CM | POA: Diagnosis not present

## 2020-07-18 DIAGNOSIS — J449 Chronic obstructive pulmonary disease, unspecified: Secondary | ICD-10-CM | POA: Diagnosis not present

## 2020-07-18 DIAGNOSIS — K420 Umbilical hernia with obstruction, without gangrene: Secondary | ICD-10-CM | POA: Diagnosis present

## 2020-07-18 HISTORY — PX: UMBILICAL HERNIA REPAIR: SHX196

## 2020-07-18 SURGERY — REPAIR, HERNIA, UMBILICAL, ADULT
Anesthesia: General

## 2020-07-18 MED ORDER — CHLORHEXIDINE GLUCONATE 0.12 % MT SOLN: 15.0000 mL | Freq: Once | OROMUCOSAL | Status: AC

## 2020-07-18 MED ORDER — LIDOCAINE HCL (CARDIAC) PF 100 MG/5ML IV SOSY
PREFILLED_SYRINGE | INTRAVENOUS | Status: DC | PRN
  Administered 2020-07-18: 100 mg via INTRAVENOUS

## 2020-07-18 MED ORDER — SUGAMMADEX SODIUM 200 MG/2ML IV SOLN
INTRAVENOUS | Status: DC | PRN
  Administered 2020-07-18: 335.6 mg via INTRAVENOUS

## 2020-07-18 MED ORDER — MIDAZOLAM HCL 2 MG/2ML IJ SOLN
INTRAMUSCULAR | Status: AC
  Filled 2020-07-18: qty 2

## 2020-07-18 MED ORDER — CHLORHEXIDINE GLUCONATE CLOTH 2 % EX PADS: 6.0000 | MEDICATED_PAD | Freq: Once | CUTANEOUS | Status: DC

## 2020-07-18 MED ORDER — ORAL CARE MOUTH RINSE: 15.0000 mL | Freq: Once | OROMUCOSAL | Status: AC

## 2020-07-18 MED ORDER — GLYCOPYRROLATE 0.2 MG/ML IJ SOLN
INTRAMUSCULAR | Status: DC | PRN
  Administered 2020-07-18: .2 mg via INTRAVENOUS

## 2020-07-18 MED ORDER — FAMOTIDINE 20 MG PO TABS
ORAL_TABLET | ORAL | Status: AC
  Administered 2020-07-18: 20 mg via ORAL
  Filled 2020-07-18: qty 1

## 2020-07-18 MED ORDER — CELECOXIB 200 MG PO CAPS: 200.0000 mg | ORAL_CAPSULE | ORAL | Status: AC

## 2020-07-18 MED ORDER — FAMOTIDINE 20 MG PO TABS: 20.0000 mg | ORAL_TABLET | Freq: Once | ORAL | Status: AC

## 2020-07-18 MED ORDER — IBUPROFEN 800 MG PO TABS: 800.0000 mg | ORAL_TABLET | Freq: Three times a day (TID) | ORAL | 0 refills | Status: DC | PRN

## 2020-07-18 MED ORDER — DEXAMETHASONE SODIUM PHOSPHATE 10 MG/ML IJ SOLN
INTRAMUSCULAR | Status: DC | PRN
  Administered 2020-07-18: 10 mg via INTRAVENOUS

## 2020-07-18 MED ORDER — ACETAMINOPHEN 500 MG PO TABS
ORAL_TABLET | ORAL | Status: AC
  Administered 2020-07-18: 1000 mg via ORAL
  Filled 2020-07-18: qty 2

## 2020-07-18 MED ORDER — BUPIVACAINE-EPINEPHRINE 0.25% -1:200000 IJ SOLN
INTRAMUSCULAR | Status: DC | PRN
  Administered 2020-07-18: 10 mL

## 2020-07-18 MED ORDER — IBUPROFEN 800 MG PO TABS
ORAL_TABLET | ORAL | Status: AC
  Filled 2020-07-18: qty 1

## 2020-07-18 MED ORDER — FENTANYL CITRATE (PF) 100 MCG/2ML IJ SOLN
INTRAMUSCULAR | Status: AC
  Filled 2020-07-18: qty 2

## 2020-07-18 MED ORDER — CELECOXIB 200 MG PO CAPS
ORAL_CAPSULE | ORAL | Status: AC
  Administered 2020-07-18: 200 mg via ORAL
  Filled 2020-07-18: qty 1

## 2020-07-18 MED ORDER — PROPOFOL 10 MG/ML IV BOLUS
INTRAVENOUS | Status: DC | PRN
  Administered 2020-07-18: 200 mg via INTRAVENOUS

## 2020-07-18 MED ORDER — CEFAZOLIN SODIUM-DEXTROSE 2-4 GM/100ML-% IV SOLN
2.0000 g | INTRAVENOUS | Status: AC
  Administered 2020-07-18: 2 g via INTRAVENOUS

## 2020-07-18 MED ORDER — LIDOCAINE HCL (PF) 2 % IJ SOLN
INTRAMUSCULAR | Status: AC
  Filled 2020-07-18: qty 5

## 2020-07-18 MED ORDER — MIDAZOLAM HCL 2 MG/2ML IJ SOLN
INTRAMUSCULAR | Status: DC | PRN
  Administered 2020-07-18: 2 mg via INTRAVENOUS

## 2020-07-18 MED ORDER — ONDANSETRON HCL 4 MG/2ML IJ SOLN
INTRAMUSCULAR | Status: DC | PRN
  Administered 2020-07-18: 4 mg via INTRAVENOUS

## 2020-07-18 MED ORDER — ACETAMINOPHEN 500 MG PO TABS: 1000.0000 mg | ORAL_TABLET | ORAL | Status: AC

## 2020-07-18 MED ORDER — LACTATED RINGERS IV SOLN: INTRAVENOUS | Status: DC

## 2020-07-18 MED ORDER — PROPOFOL 10 MG/ML IV BOLUS
INTRAVENOUS | Status: AC
  Filled 2020-07-18: qty 20

## 2020-07-18 MED ORDER — GLYCOPYRROLATE 0.2 MG/ML IJ SOLN
INTRAMUSCULAR | Status: AC
  Filled 2020-07-18: qty 1

## 2020-07-18 MED ORDER — BUPIVACAINE-EPINEPHRINE (PF) 0.25% -1:200000 IJ SOLN
INTRAMUSCULAR | Status: AC
  Filled 2020-07-18: qty 30

## 2020-07-18 MED ORDER — ONDANSETRON HCL 4 MG/2ML IJ SOLN
INTRAMUSCULAR | Status: AC
  Filled 2020-07-18: qty 2

## 2020-07-18 MED ORDER — CHLORHEXIDINE GLUCONATE 0.12 % MT SOLN
OROMUCOSAL | Status: AC
  Administered 2020-07-18: 15 mL via OROMUCOSAL
  Filled 2020-07-18: qty 15

## 2020-07-18 MED ORDER — CEFAZOLIN SODIUM-DEXTROSE 2-4 GM/100ML-% IV SOLN
INTRAVENOUS | Status: AC
  Filled 2020-07-18: qty 100

## 2020-07-18 MED ORDER — DEXAMETHASONE SODIUM PHOSPHATE 10 MG/ML IJ SOLN
INTRAMUSCULAR | Status: AC
  Filled 2020-07-18: qty 1

## 2020-07-18 MED ORDER — BUPIVACAINE LIPOSOME 1.3 % IJ SUSP: 20.0000 mL | Freq: Once | INTRAMUSCULAR | Status: DC

## 2020-07-18 MED ORDER — FENTANYL CITRATE (PF) 100 MCG/2ML IJ SOLN
25.0000 ug | INTRAMUSCULAR | Status: DC | PRN
  Administered 2020-07-18: 25 ug via INTRAVENOUS

## 2020-07-18 MED ORDER — FENTANYL CITRATE (PF) 100 MCG/2ML IJ SOLN
INTRAMUSCULAR | Status: DC | PRN
Start: 2020-07-18 — End: 2020-07-18
  Administered 2020-07-18: 50 ug via INTRAVENOUS
  Administered 2020-07-18: 25 ug via INTRAVENOUS

## 2020-07-18 MED ORDER — GABAPENTIN 300 MG PO CAPS: 300.0000 mg | ORAL_CAPSULE | ORAL | Status: AC

## 2020-07-18 MED ORDER — ROCURONIUM BROMIDE 10 MG/ML (PF) SYRINGE
PREFILLED_SYRINGE | INTRAVENOUS | Status: AC
  Filled 2020-07-18: qty 10

## 2020-07-18 MED ORDER — ONDANSETRON HCL 4 MG/2ML IJ SOLN: 4.0000 mg | Freq: Once | INTRAMUSCULAR | Status: DC | PRN

## 2020-07-18 MED ORDER — GABAPENTIN 300 MG PO CAPS
ORAL_CAPSULE | ORAL | Status: AC
  Administered 2020-07-18: 300 mg via ORAL
  Filled 2020-07-18: qty 1

## 2020-07-18 MED ORDER — IBUPROFEN 800 MG PO TABS
800.0000 mg | ORAL_TABLET | Freq: Three times a day (TID) | ORAL | Status: DC | PRN
  Administered 2020-07-18: 800 mg via ORAL
  Filled 2020-07-18 (×2): qty 1

## 2020-07-18 MED ORDER — ROCURONIUM BROMIDE 100 MG/10ML IV SOLN
INTRAVENOUS | Status: DC | PRN
  Administered 2020-07-18: 50 mg via INTRAVENOUS

## 2020-07-18 SURGICAL SUPPLY — 33 items
ADH SKN CLS APL DERMABOND .7 (GAUZE/BANDAGES/DRESSINGS) ×1
APL PRP STRL LF DISP 70% ISPRP (MISCELLANEOUS) ×1
BLADE CLIPPER SURG (BLADE) ×3 IMPLANT
BLADE SURG 15 STRL LF DISP TIS (BLADE) ×1 IMPLANT
BLADE SURG 15 STRL SS (BLADE) ×3
CANISTER SUCT 1200ML W/VALVE (MISCELLANEOUS) ×3 IMPLANT
CHLORAPREP W/TINT 26 (MISCELLANEOUS) ×3 IMPLANT
COVER WAND RF STERILE (DRAPES) ×3 IMPLANT
DECANTER SPIKE VIAL GLASS SM (MISCELLANEOUS) ×3 IMPLANT
DERMABOND ADVANCED (GAUZE/BANDAGES/DRESSINGS) ×2
DERMABOND ADVANCED .7 DNX12 (GAUZE/BANDAGES/DRESSINGS) ×1 IMPLANT
DRAPE LAPAROTOMY 77X122 PED (DRAPES) ×3 IMPLANT
ELECT CAUTERY BLADE 6.4 (BLADE) ×3 IMPLANT
ELECT REM PT RETURN 9FT ADLT (ELECTROSURGICAL) ×3
ELECTRODE REM PT RTRN 9FT ADLT (ELECTROSURGICAL) ×1 IMPLANT
GLOVE BIOGEL M 7.0 STRL (GLOVE) ×3 IMPLANT
GLOVE BIOGEL PI IND STRL 7.0 (GLOVE) ×3 IMPLANT
GLOVE BIOGEL PI INDICATOR 7.0 (GLOVE) ×6
GLOVE ORTHO TXT STRL SZ7.5 (GLOVE) ×3 IMPLANT
GLOVE SURG SYN 6.5 ES PF (GLOVE) ×3 IMPLANT
GOWN STRL REUS W/ TWL LRG LVL3 (GOWN DISPOSABLE) ×3 IMPLANT
GOWN STRL REUS W/TWL LRG LVL3 (GOWN DISPOSABLE) ×9
KIT TURNOVER KIT A (KITS) ×3 IMPLANT
NEEDLE HYPO 22GX1.5 SAFETY (NEEDLE) ×3 IMPLANT
NS IRRIG 500ML POUR BTL (IV SOLUTION) ×3 IMPLANT
PACK BASIN MINOR (MISCELLANEOUS) ×3 IMPLANT
SUT ETHIBOND 0 MO6 C/R (SUTURE) ×3 IMPLANT
SUT MNCRL 4-0 (SUTURE) ×3
SUT MNCRL 4-0 27XMFL (SUTURE) ×1
SUT VIC AB 3-0 SH 27 (SUTURE) ×3
SUT VIC AB 3-0 SH 27X BRD (SUTURE) ×1 IMPLANT
SUTURE MNCRL 4-0 27XMF (SUTURE) ×1 IMPLANT
SYR 10ML LL (SYRINGE) ×3 IMPLANT

## 2020-07-18 NOTE — Interval H&P Note (Signed)
History and Physical Interval Note:  07/18/2020 7:26 AM  Steven Hughes  has presented today for surgery, with the diagnosis of Umbilical hernia.  The various methods of treatment have been discussed with the patient and family. After consideration of risks, benefits and other options for treatment, the patient has consented to  Procedure(s): HERNIA REPAIR UMBILICAL ADULT (N/A) as a surgical intervention.  The patient's history has been reviewed, patient examined, no change in status, stable for surgery.  I have reviewed the patient's chart and labs.  Questions were answered to the patient's satisfaction.     Campbell Lerner

## 2020-07-18 NOTE — Anesthesia Procedure Notes (Signed)
Procedure Name: Intubation Date/Time: 07/18/2020 7:44 AM Performed by: Doreen Salvage, CRNA Pre-anesthesia Checklist: Patient identified, Patient being monitored, Timeout performed, Emergency Drugs available and Suction available Patient Re-evaluated:Patient Re-evaluated prior to induction Oxygen Delivery Method: Circle system utilized Preoxygenation: Pre-oxygenation with 100% oxygen Induction Type: IV induction Ventilation: Mask ventilation without difficulty Laryngoscope Size: Mac, McGraph and 4 Grade View: Grade I Tube type: Oral Tube size: 7.5 mm Number of attempts: 1 Airway Equipment and Method: Stylet Placement Confirmation: ETT inserted through vocal cords under direct vision,  positive ETCO2 and breath sounds checked- equal and bilateral Secured at: 23 cm Tube secured with: Tape Dental Injury: Teeth and Oropharynx as per pre-operative assessment

## 2020-07-18 NOTE — Addendum Note (Signed)
Addendum  created 07/18/20 1019 by Stormy Fabian, CRNA   Flowsheet accepted, Intraprocedure Flowsheets edited

## 2020-07-18 NOTE — Anesthesia Postprocedure Evaluation (Signed)
Anesthesia Post Note  Patient: Steven Hughes  Procedure(s) Performed: HERNIA REPAIR UMBILICAL ADULT (N/A )  Patient location during evaluation: PACU Anesthesia Type: General Level of consciousness: awake and alert Pain management: pain level controlled Vital Signs Assessment: post-procedure vital signs reviewed and stable Respiratory status: spontaneous breathing and respiratory function stable Cardiovascular status: stable Anesthetic complications: no   No complications documented.   Last Vitals:  Vitals:   07/18/20 0614 07/18/20 0831  BP: 131/86 128/70  Pulse: (!) 58 93  Resp: 16 15  Temp: (!) 36.1 C 36.9 C  SpO2: 100% 100%    Last Pain:  Vitals:   07/18/20 0846  TempSrc:   PainSc: Asleep                 Eileen Kangas K

## 2020-07-18 NOTE — Discharge Instructions (Signed)

## 2020-07-18 NOTE — Transfer of Care (Signed)
Immediate Anesthesia Transfer of Care Note  Patient: Demir Titsworth  Procedure(s) Performed: Procedure(s): HERNIA REPAIR UMBILICAL ADULT (N/A)  Patient Location: PACU  Anesthesia Type:General  Level of Consciousness: sedated  Airway & Oxygen Therapy: Patient Spontanous Breathing and Patient connected to face mask oxygen  Post-op Assessment: Report given to RN and Post -op Vital signs reviewed and stable  Post vital signs: Reviewed and stable  Last Vitals:  Vitals:   07/18/20 0614 07/18/20 0831  BP: 131/86 128/70  Pulse: (!) 58 93  Resp: 16 15  Temp: (!) 36.1 C 36.9 C  SpO2: 100% 100%    Complications: No apparent anesthesia complications

## 2020-07-18 NOTE — Anesthesia Preprocedure Evaluation (Signed)
Anesthesia Evaluation  Patient identified by MRN, date of birth, ID band Patient awake    Reviewed: Allergy & Precautions, NPO status , Patient's Chart, lab work & pertinent test results  History of Anesthesia Complications (+) history of anesthetic complications (broken teeth)  Airway Mallampati: II       Dental   Pulmonary asthma , COPD,  COPD inhaler,           Cardiovascular hypertension, Pt. on medications (-) Past MI and (-) CHF + dysrhythmias (Pt states he had "PVCs", but had an ablation which resolved the problem) (-) Valvular Problems/Murmurs     Neuro/Psych neg Seizures    GI/Hepatic Neg liver ROS, neg GERD  ,  Endo/Other  neg diabetes  Renal/GU negative Renal ROS     Musculoskeletal   Abdominal   Peds  Hematology  (+) anemia ,   Anesthesia Other Findings   Reproductive/Obstetrics                            Anesthesia Physical Anesthesia Plan  ASA: II  Anesthesia Plan: General   Post-op Pain Management:    Induction: Intravenous  PONV Risk Score and Plan: 2 and Ondansetron and Dexamethasone  Airway Management Planned: Oral ETT  Additional Equipment:   Intra-op Plan:   Post-operative Plan:   Informed Consent: I have reviewed the patients History and Physical, chart, labs and discussed the procedure including the risks, benefits and alternatives for the proposed anesthesia with the patient or authorized representative who has indicated his/her understanding and acceptance.       Plan Discussed with:   Anesthesia Plan Comments:         Anesthesia Quick Evaluation

## 2020-07-18 NOTE — Op Note (Signed)
Umbilical Hernia Repair  Pre-operative Diagnosis: Umbilical hernia  Post-operative Diagnosis: same  Surgeon: Campbell Lerner, MD FACS  Anesthesia: General   Findings: 1.2 cm fascial defect diameter    Estimated Blood Loss: 5 mL                 Specimens: sac, incarcerated fat, discarded.         Complications: none              Procedure Details  The patient was seen again in the Holding Room. The benefits, complications, treatment options, and expected outcomes were discussed with the patient. The risks of bleeding, infection, recurrence of symptoms, failure to resolve symptoms, bowel injury, mesh placement, mesh infection, any of which could require further surgery were reviewed with the patient. The likelihood of improving the patient's symptoms with return to their baseline status is good.  The patient and/or family concurred with the proposed plan, giving informed consent.  The patient was taken to Operating Room, identified, and the procedure verified.  A Time Out was held and the above information confirmed.  Prior to the induction of general anesthesia, antibiotic prophylaxis was administered. VTE prophylaxis was in place. General endotracheal anesthesia was then administered and tolerated well. After the induction, the abdomen was prepped with Chloraprep and draped in the sterile fashion. The patient was positioned in the supine position.  Local anesthesia of quarter percent Marcaine with epinephrine was utilized to infiltrate the region. Incision was created with a scalpel over the hernia defect. Electrocautery was used to dissect through subcutaneous tissue, the hernia sac, composed of preperitoneal adipose, was opened/excised. The hernia was measured.  1.2 cm I closed the hernia defect with interrupted 0 Ethibond sutures in a vest overpants fashion.   Incision was closed  with 4-0 Monocryl. Dermabond was used to coat the skin. Marcaine quarter percent with epinephrine was  used to inject all the incision sites. Patient tolerated procedure well and there were no immediate complications. Needle and laparotomy counts were correct   Campbell Lerner, M.D., [facility]  07/18/2020 ; 8:44 AM

## 2020-07-31 ENCOUNTER — Other Ambulatory Visit: Payer: Self-pay

## 2020-07-31 ENCOUNTER — Encounter: Payer: Self-pay | Admitting: Surgery

## 2020-07-31 ENCOUNTER — Ambulatory Visit (INDEPENDENT_AMBULATORY_CARE_PROVIDER_SITE_OTHER): Payer: BC Managed Care – PPO | Admitting: Surgery

## 2020-07-31 VITALS — BP 130/78 | HR 71 | Temp 98.7°F | Ht 70.0 in | Wt 192.0 lb

## 2020-07-31 DIAGNOSIS — Z09 Encounter for follow-up examination after completed treatment for conditions other than malignant neoplasm: Secondary | ICD-10-CM

## 2020-07-31 NOTE — Patient Instructions (Addendum)
Dr.Rodenberg recommends patient to refrain from any heavy lifting, bending, pushing or pulling for 2 more weeks. Patient advised to NOT take the Meloxicam (Mobic) and Ibuprofen together, patient is to take one or the other. Follow-up with our office as needed. Please call and ask to speak with a nurse if you develop questions or concerns.  GENERAL POST-OPERATIVE PATIENT INSTRUCTIONS   WOUND CARE INSTRUCTIONS:  Keep a dry clean dressing on the wound if there is drainage. The initial bandage may be removed after 24 hours.  Once the wound has quit draining you may leave it open to air.  If clothing rubs against the wound or causes irritation and the wound is not draining you may cover it with a dry dressing during the daytime.  Try to keep the wound dry and avoid ointments on the wound unless directed to do so.  If the wound becomes bright red and painful or starts to drain infected material that is not clear, please contact your physician immediately.  If the wound is mildly pink and has a thick firm ridge underneath it, this is normal, and is referred to as a healing ridge.  This will resolve over the next 4-6 weeks.  BATHING: You may shower if you have been informed of this by your surgeon. However, Please do not submerge in a tub, hot tub, or pool until incisions are completely sealed or have been told by your surgeon that you may do so.  DIET:  You may eat any foods that you can tolerate.  It is a good idea to eat a high fiber diet and take in plenty of fluids to prevent constipation.  If you do become constipated you may want to take a mild laxative or take ducolax tablets on a daily basis until your bowel habits are regular.  Constipation can be very uncomfortable, along with straining, after recent surgery.  ACTIVITY:  You are encouraged to cough and deep breath or use your incentive spirometer if you were given one, every 15-30 minutes when awake.  This will help prevent respiratory  complications and low grade fevers post-operatively if you had a general anesthetic.  You may want to hug a pillow when coughing and sneezing to add additional support to the surgical area, if you had abdominal or chest surgery, which will decrease pain during these times.  You are encouraged to walk and engage in light activity for the next two weeks.  You should not lift more than 20 pounds, until 4 to 6 weeks after surgery as it could put you at increased risk for complications.  Twenty pounds is roughly equivalent to a plastic bag of groceries. At that time- Listen to your body when lifting, if you have pain when lifting, stop and then try again in a few days. Soreness after doing exercises or activities of daily living is normal as you get back in to your normal routine.  MEDICATIONS:  Try to take narcotic medications and anti-inflammatory medications, such as tylenol, ibuprofen, naprosyn, etc., with food.  This will minimize stomach upset from the medication.  Should you develop nausea and vomiting from the pain medication, or develop a rash, please discontinue the medication and contact your physician.  You should not drive, make important decisions, or operate machinery when taking narcotic pain medication.  SUNBLOCK Use sun block to incision area over the next year if this area will be exposed to sun. This helps decrease scarring and will allow you avoid a permanent darkened  area over your incision.  QUESTIONS:  Please feel free to call our office if you have any questions, and we will be glad to assist you. [phone].

## 2020-07-31 NOTE — Progress Notes (Signed)
[facility] POST-OP OFFICE VISIT  07/31/2020  HPI: Steven Hughes is a 44 y.o. male 13 days s/p supraumbilical hernia repair.  He is concerned about a slight degree of asymmetry.  Otherwise denies any significant pain.  Vital signs: BP 130/78   Pulse 71   Temp 98.7 F (37.1 C) (Oral)   Ht 5\' 10"  (1.778 m)   Wt 192 lb (87.1 kg)   SpO2 98%   BMI 27.55 kg/m    Physical Exam: Constitutional: He appears well. Abdomen: Soft, nontender. Skin: Incision well-healed, clean, dry and intact.  Assessment/Plan: This is a 44 y.o. male 13 days s/p supraumbilical hernia repair, no utilization of mesh.  Patient Active Problem List   Diagnosis Date Noted  . Right upper quadrant abdominal pain 06/26/2020    -We discussed increasing his activity level, but avoiding heavy lifting for now.  Follow-up as needed.   08/26/2020 M.D., FACS 07/31/2020, 1:52 PM

## 2021-08-02 IMAGING — NM NM HEPATO W/GB/PHARM/[PERSON_NAME]
2 series · 12 of 12 positions shown · non-contrast
Comparison: None.

CLINICAL DATA: Right upper quadrant abdominal pain.

EXAM:
NUCLEAR MEDICINE HEPATOBILIARY IMAGING WITH GALLBLADDER EF
TECHNIQUE: Sequential images of the abdomen were obtained [DATE] minutes
following intravenous administration of radiopharmaceutical. After
oral ingestion of Ensure, gallbladder ejection fraction was
determined. At 60 min, normal ejection fraction is greater than 33%.
RADIOPHARMACEUTICALS:  5.287 mCi Sc-66m  Choletec IV

[Series 1000: hepatobiliary scan · 9.59mm/px · 6 of 60 frames shown]
[frame 6/60]
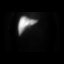
[frame 16/60]
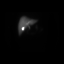
[frame 26/60]
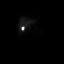
[frame 36/60]
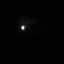
[frame 46/60]
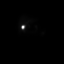
[frame 56/60]
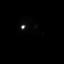

[Series 1000: gallbladder ef · 4.80mm/px · 6 of 120 frames shown]
[frame 11/120]
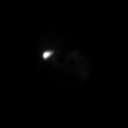
[frame 31/120]
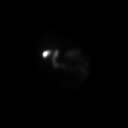
[frame 51/120]
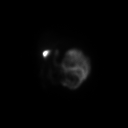
[frame 71/120]
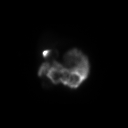
[frame 91/120]
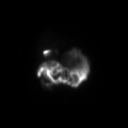
[frame 111/120]
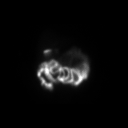

[12 of 12 positions shown; findings below may reference images not displayed]

FINDINGS: Prompt uptake and biliary excretion of activity by the liver is
seen. Gallbladder activity is visualized, consistent with patency of
cystic duct. Biliary activity passes into small bowel, consistent
with patent common bile duct.

Calculated gallbladder ejection fraction is 98%. (Normal gallbladder
ejection fraction with Ensure is greater than 33%.) Patient did
report some pain after ensure consumption.
IMPRESSION: Normal gallbladder ejection fraction after Ensure administration.

## 2022-01-02 IMAGING — CT CT ABD-PELV W/ CM
2 of 5 series · 16 of 46 positions shown, 18 images · IV contrast (omnipaque)
Comparison: None.

CLINICAL DATA: RIGHT upper quadrant pain since 2042.

EXAM:
CT ABDOMEN AND PELVIS WITH CONTRAST
TECHNIQUE: Multidetector CT imaging of the abdomen and pelvis was performed
using the standard protocol following bolus administration of
intravenous contrast.
CONTRAST:  100mL OMNIPAQUE IOHEXOL 300 MG/ML  SOLN

[Series 2: abd pelvis 5.00 · axial · 0.74mm/px · z∈[-1576,-1121]mm · 13 of 103 slices shown, 15 images]
[im 6/103  soft-tissue]
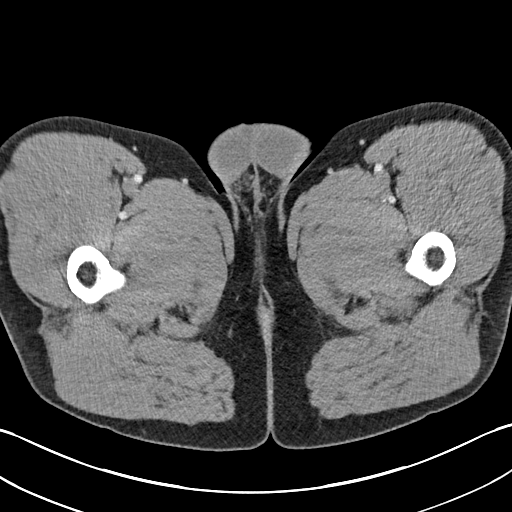
[im 6/103  bone]
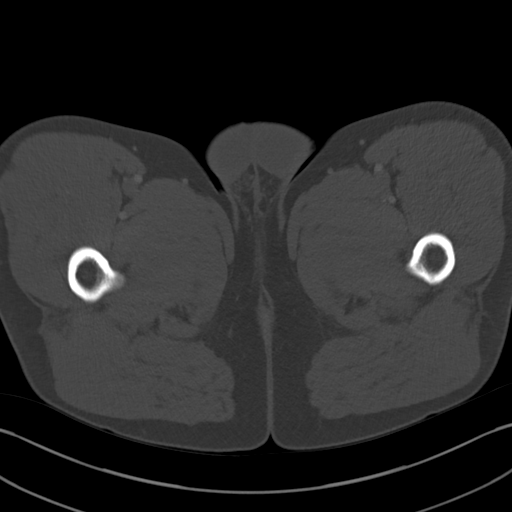
[im 17/103  soft-tissue]
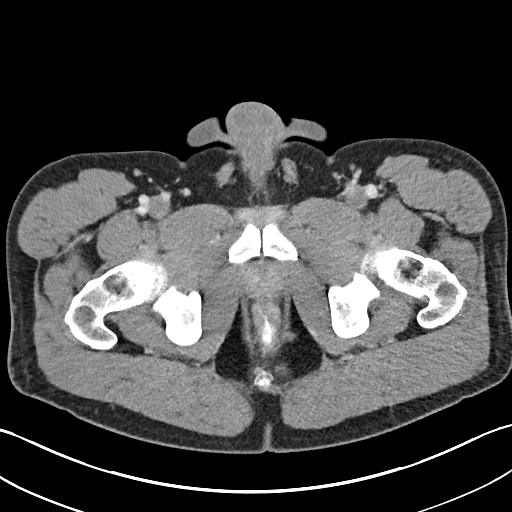
[im 22/103  soft-tissue]
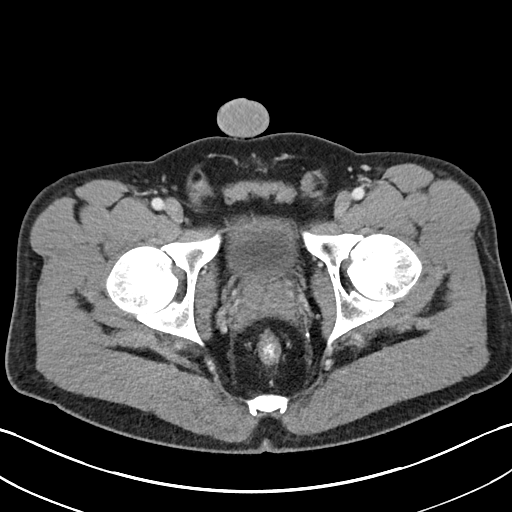
[im 27/103  soft-tissue]
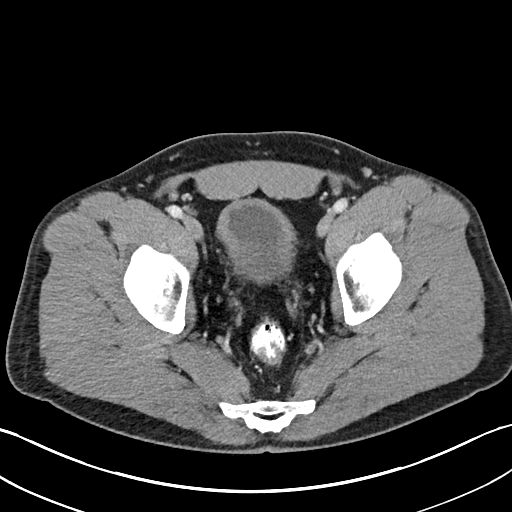
[im 38/103  soft-tissue]
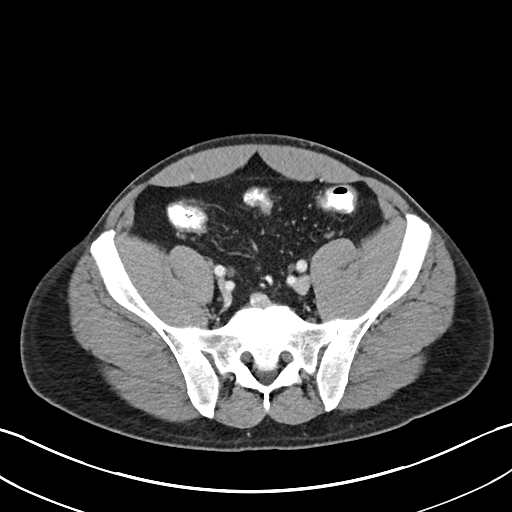
[im 43/103  soft-tissue]
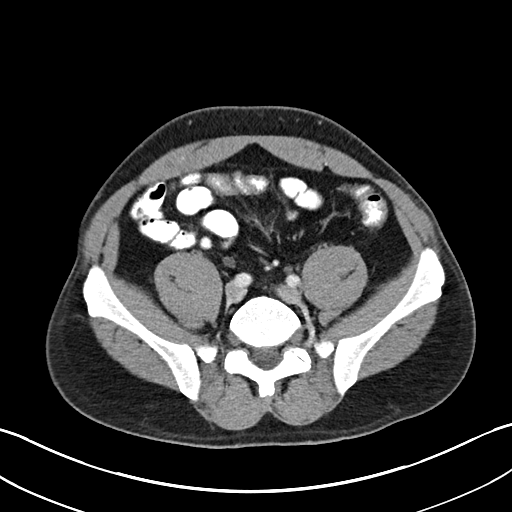
[im 54/103  soft-tissue]
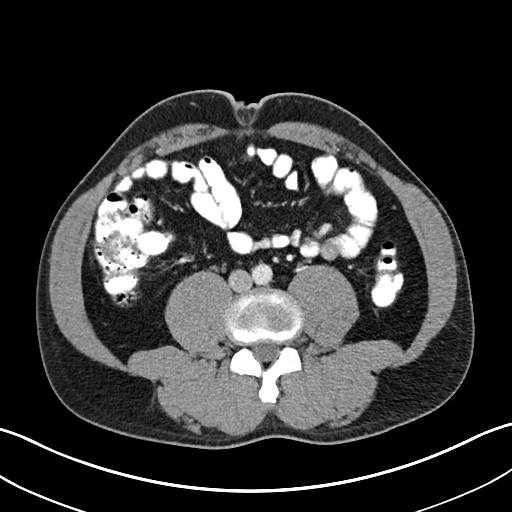
[im 60/103  soft-tissue]
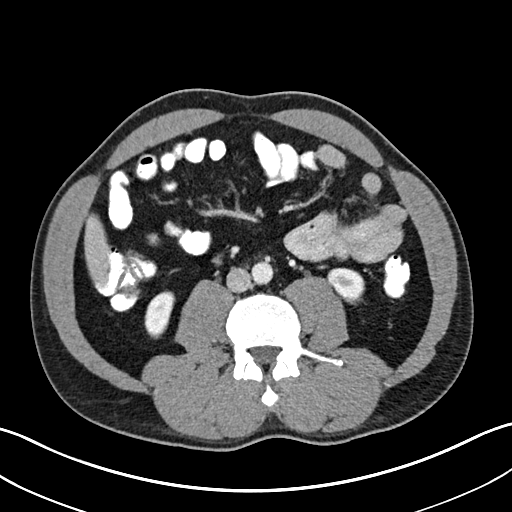
[im 65/103  soft-tissue]
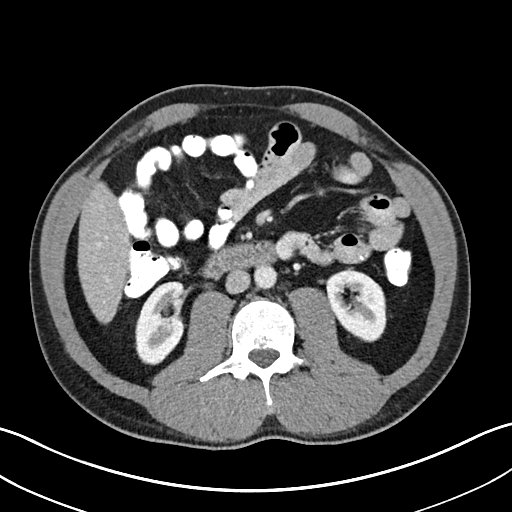
[im 65/103  bone]
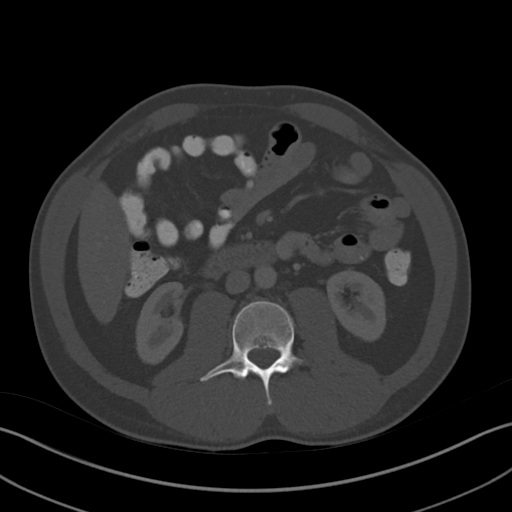
[im 76/103  soft-tissue]
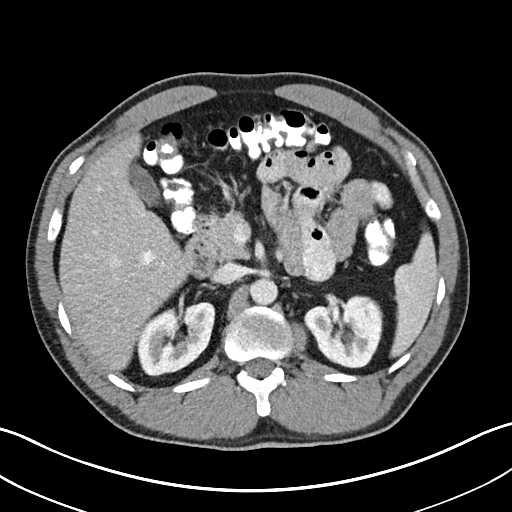
[im 81/103  soft-tissue]
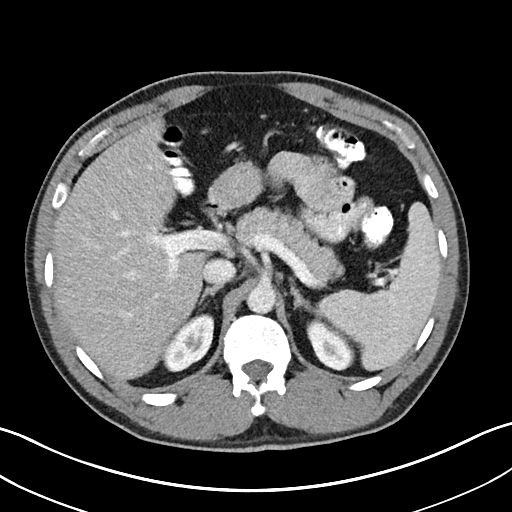
[im 86/103  soft-tissue]
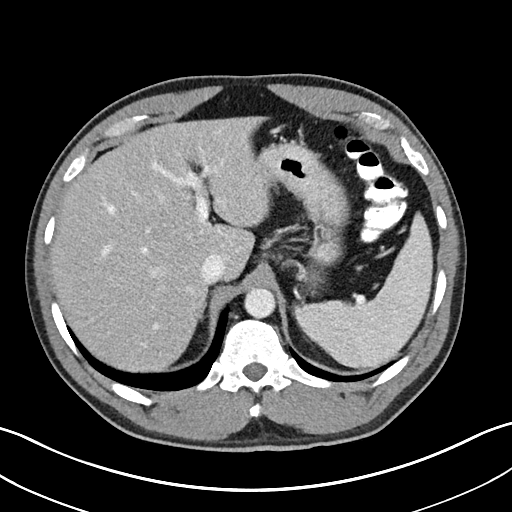
[im 97/103  soft-tissue]
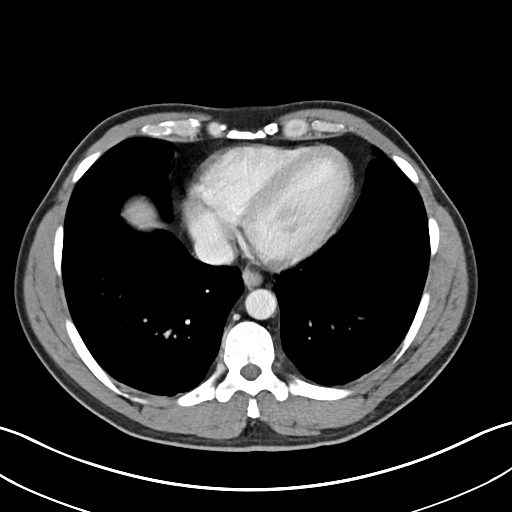

[Series 4: coronals abd pelvis 2.00 cor · coronal · 0.74mm/px · 3 of 143 slices shown]
[im 48/143  soft-tissue]
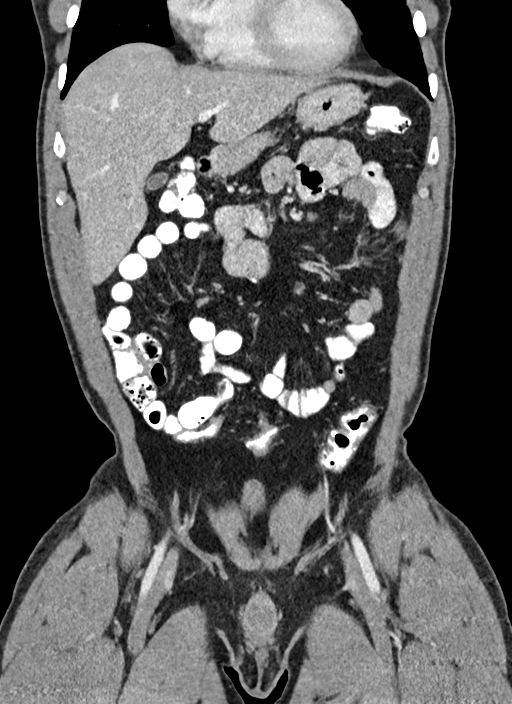
[im 64/143  soft-tissue]
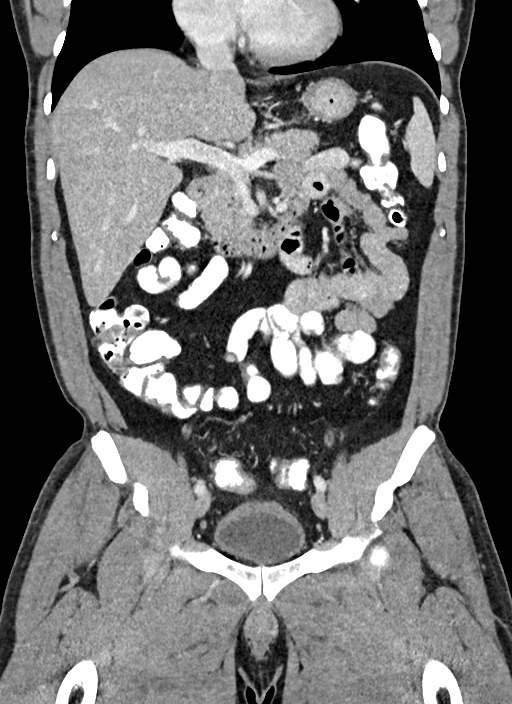
[im 79/143  soft-tissue]
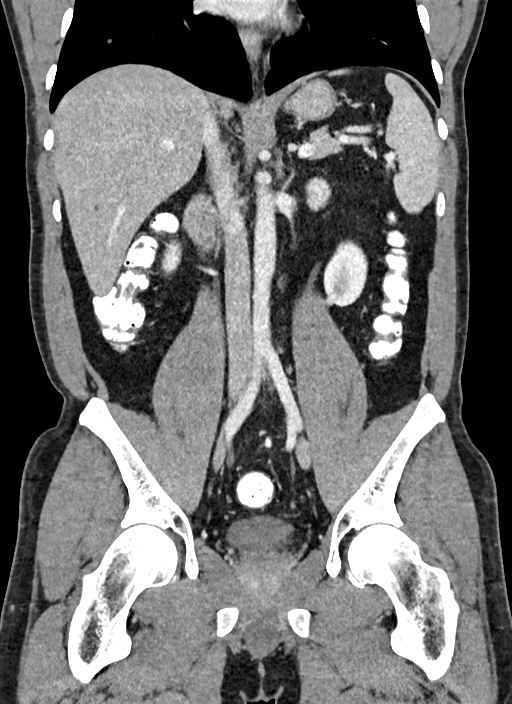

[16 of 46 positions shown; findings below may reference images not displayed]

FINDINGS: Lower chest: No acute abnormality.

Hepatobiliary: Focal fatty deposition adjacent to the falciform
ligament. No suspicious focal hepatic lesion. Gallbladder is
unremarkable. No extrahepatic biliary ductal dilation.

Pancreas: Unremarkable. No pancreatic ductal dilatation or
surrounding inflammatory changes.

Spleen: Normal in size without focal abnormality.

Adrenals/Urinary Tract: Adrenal glands are unremarkable. Kidneys are
normal, without renal calculi, focal lesion, or hydronephrosis.
Bladder is unremarkable.

Stomach/Bowel: Stomach is within normal limits. Appendix appears
normal. No evidence of bowel wall thickening, distention, or
inflammatory changes.

Vascular/Lymphatic: No significant vascular findings are present. No
enlarged abdominal or pelvic lymph nodes.

Reproductive: Prostate is unremarkable.

Other: No free fluid.

Musculoskeletal: No acute or significant osseous findings.
IMPRESSION: No acute intra-abdominal findings.

## 2022-05-25 ENCOUNTER — Other Ambulatory Visit: Payer: Self-pay

## 2022-05-25 ENCOUNTER — Emergency Department
Admission: EM | Admit: 2022-05-25 | Discharge: 2022-05-25 | Disposition: A | Discharge status: Home / Self Care | Payer: BC Managed Care – PPO | Attending: Emergency Medicine | Admitting: Emergency Medicine

## 2022-05-25 ENCOUNTER — Encounter: Payer: Self-pay | Admitting: Emergency Medicine

## 2022-05-25 DIAGNOSIS — R519 Headache, unspecified: Secondary | ICD-10-CM | POA: Diagnosis not present

## 2022-05-25 DIAGNOSIS — Z20822 Contact with and (suspected) exposure to covid-19: Secondary | ICD-10-CM | POA: Diagnosis not present

## 2022-05-25 DIAGNOSIS — L02412 Cutaneous abscess of left axilla: Secondary | ICD-10-CM

## 2022-05-25 DIAGNOSIS — R509 Fever, unspecified: Secondary | ICD-10-CM | POA: Diagnosis present

## 2022-05-25 LAB — CBC WITH DIFFERENTIAL/PLATELET
Abs Immature Granulocytes: 0.05 10*3/uL (ref 0.00–0.07)
Basophils Absolute: 0 10*3/uL (ref 0.0–0.1)
Basophils Relative: 0 %
Eosinophils Absolute: 0 10*3/uL (ref 0.0–0.5)
Eosinophils Relative: 0 %
HCT: 44 % (ref 39.0–52.0)
Hemoglobin: 15 g/dL (ref 13.0–17.0)
Immature Granulocytes: 0 %
Lymphocytes Relative: 12 %
Lymphs Abs: 1.5 10*3/uL (ref 0.7–4.0)
MCH: 31.2 pg (ref 26.0–34.0)
MCHC: 34.1 g/dL (ref 30.0–36.0)
MCV: 91.5 fL (ref 80.0–100.0)
Monocytes Absolute: 1.4 10*3/uL — ABNORMAL HIGH (ref 0.1–1.0)
Monocytes Relative: 11 %
Neutro Abs: 9.3 10*3/uL — ABNORMAL HIGH (ref 1.7–7.7)
Neutrophils Relative %: 77 %
Platelets: 241 10*3/uL (ref 150–400)
RBC: 4.81 MIL/uL (ref 4.22–5.81)
RDW: 12.1 % (ref 11.5–15.5)
WBC: 12.2 10*3/uL — ABNORMAL HIGH (ref 4.0–10.5)
nRBC: 0 % (ref 0.0–0.2)

## 2022-05-25 LAB — COMPREHENSIVE METABOLIC PANEL
ALT: 21 U/L (ref 0–44)
AST: 19 U/L (ref 15–41)
Albumin: 4.4 g/dL (ref 3.5–5.0)
Alkaline Phosphatase: 75 U/L (ref 38–126)
Anion gap: 7 (ref 5–15)
BUN: 9 mg/dL (ref 6–20)
CO2: 29 mmol/L (ref 22–32)
Calcium: 9.4 mg/dL (ref 8.9–10.3)
Chloride: 102 mmol/L (ref 98–111)
Creatinine, Ser: 1.03 mg/dL (ref 0.61–1.24)
GFR, Estimated: >60 mL/min (ref >60)
Glucose, Bld: 103 mg/dL — ABNORMAL HIGH (ref 70–99)
Potassium: 5 mmol/L (ref 3.5–5.1)
Sodium: 138 mmol/L (ref 135–145)
Total Bilirubin: 1.5 mg/dL — ABNORMAL HIGH (ref 0.3–1.2)
Total Protein: 8.2 g/dL — ABNORMAL HIGH (ref 6.5–8.1)

## 2022-05-25 LAB — URINALYSIS, ROUTINE W REFLEX MICROSCOPIC
Bilirubin Urine: NEGATIVE
Glucose, UA: NEGATIVE mg/dL
Hgb urine dipstick: NEGATIVE
Ketones, ur: NEGATIVE mg/dL
Leukocytes,Ua: NEGATIVE
Nitrite: NEGATIVE
Protein, ur: NEGATIVE mg/dL
Specific Gravity, Urine: 1.003 — ABNORMAL LOW (ref 1.005–1.030)
pH: 6 (ref 5.0–8.0)

## 2022-05-25 LAB — RESP PANEL BY RT-PCR (FLU A&B, COVID) ARPGX2
Influenza A by PCR: NEGATIVE
Influenza B by PCR: NEGATIVE
SARS Coronavirus 2 by RT PCR: NEGATIVE

## 2022-05-25 MED ORDER — ACETAMINOPHEN 325 MG PO TABS
650.0000 mg | ORAL_TABLET | Freq: Once | ORAL | Status: AC
  Administered 2022-05-25: 650 mg via ORAL
  Filled 2022-05-25: qty 2

## 2022-05-25 MED ORDER — LIDOCAINE HCL (PF) 1 % IJ SOLN
5.0000 mL | Freq: Once | INTRAMUSCULAR | Status: AC
  Administered 2022-05-25: 5 mL
  Filled 2022-05-25: qty 5

## 2022-05-25 MED ORDER — SULFAMETHOXAZOLE-TRIMETHOPRIM 800-160 MG PO TABS
1.0000 | ORAL_TABLET | Freq: Once | ORAL | Status: AC
  Administered 2022-05-25: 1 via ORAL
  Filled 2022-05-25: qty 1

## 2022-05-25 MED ORDER — PENTAFLUOROPROP-TETRAFLUOROETH EX AERO
INHALATION_SPRAY | CUTANEOUS | Status: DC | PRN
  Filled 2022-05-25: qty 30

## 2022-05-25 MED ORDER — SULFAMETHOXAZOLE-TRIMETHOPRIM 800-160 MG PO TABS: 1.0000 | ORAL_TABLET | Freq: Two times a day (BID) | ORAL | 0 refills | Status: AC

## 2022-05-25 NOTE — ED Provider Triage Note (Signed)
Emergency Medicine Provider Triage Evaluation Note  Valerie Fredin , a 46 y.o. male  was evaluated in triage.  Pt complains of fever, body aches, chills and headache that started yesterday.  He also reports a fever of 105 at home.  No other family members at home sick.  PCP started him on azithromycin yesterday.  Patient has not completely sure why this antibiotic was started.  Review of Systems  Positive: + nausea  + possible flea bite Negative: - diarrhea  Physical Exam  BP (!) 157/99 (BP Location: Left Arm)   Pulse 99   Temp 99 F (37.2 C) (Oral)   Resp 18   Ht 5\' 10"  (1.778 m)   Wt 88.5 kg   SpO2 100%   BMI 27.98 kg/m  Gen:   Awake, no distress   Resp:  Normal effort  MSK:   Moves extremities without difficulty  Other:    Medical Decision Making  Medically screening exam initiated at 2:05 PM.  Appropriate orders placed.  Carlton Buskey was informed that the remainder of the evaluation will be completed by another provider, this initial triage assessment does not replace that evaluation, and the importance of remaining in the ED until their evaluation is complete.     Rozell Searing, PA-C 05/25/22 1414

## 2022-05-25 NOTE — ED Triage Notes (Addendum)
Patient arrives ambulatory- states on Friday was fixing a machine that was infested with rats. Starting Monday feeling sick with fever, body aches, chills and headaches. Sunday night noticed a bite mark under left armpit.   Patient adds he went to his PCP yesterday and was started on azithromycin.

## 2022-05-25 NOTE — ED Provider Notes (Signed)
[facility] Emergency Department Provider Note     Event Date/Time   First MD Initiated Contact with Patient 05/25/22 1438     (approximate)   History   Fever and Generalized Body Aches   HPI  Steven Hughes is a 46 y.o. male presents himself to the ED for evaluation of generalized malaise, with intermittent low-grade fevers, body aches, chills, and headache.  Patient reports that on Friday he was fixing a machine that was infested with rats.  He denies any rat bite or rat scratches.  He notes that by Monday, he began to experience his symptoms of malaise and headache.  It was on Sunday night that he noticed a single bite mark under his left armpit.  Patient presented to his PCP yesterday, was started on azithromycin for an unclear etiology.  He presents today for ongoing evaluation.    Physical Exam   Triage Vital Signs: ED Triage Vitals  Enc Vitals Group     BP 05/25/22 1356 (!) 157/99     Pulse Rate 05/25/22 1356 99     Resp 05/25/22 1356 18     Temp 05/25/22 1356 99 F (37.2 C)     Temp Source 05/25/22 1356 Oral     SpO2 05/25/22 1356 100 %     Weight 05/25/22 1358 195 lb (88.5 kg)     Height 05/25/22 1358 5\' 10"  (1.778 m)     Head Circumference --      Peak Flow --      Pain Score 05/25/22 1357 10     Pain Loc --      Pain Edu? --      Excl. in GC? --     Most recent vital signs: Vitals:   05/25/22 1356 05/25/22 1735  BP: (!) 157/99 (!) 143/76  Pulse: 99 74  Resp: 18 17  Temp: 99 F (37.2 C) 99.2 F (37.3 C)  SpO2: 100% 99%    General Awake, no distress.  CV:  Good peripheral perfusion.  RESP:  Normal effort.  ABD:  No distention.  SKIN:  Left axilla with a firm cystic lesion with some overlying erythema and tenderness.  No fluctuance is appreciated.  Pointing without spontaneous purulent drainage is noted.   ED Results / Procedures / Treatments   Labs (all labs ordered are listed, but only abnormal results are  displayed) Labs Reviewed  COMPREHENSIVE METABOLIC PANEL - Abnormal; Notable for the following components:      Result Value   Glucose, Bld 103 (*)    Total Protein 8.2 (*)    Total Bilirubin 1.5 (*)    All other components within normal limits  CBC WITH DIFFERENTIAL/PLATELET - Abnormal; Notable for the following components:   WBC 12.2 (*)    Neutro Abs 9.3 (*)    Monocytes Absolute 1.4 (*)    All other components within normal limits  URINALYSIS, ROUTINE W REFLEX MICROSCOPIC - Abnormal; Notable for the following components:   Color, Urine STRAW (*)    APPearance CLEAR (*)    Specific Gravity, Urine 1.003 (*)    All other components within normal limits  RESP PANEL BY RT-PCR (FLU A&B, COVID) [id number]     EKG    RADIOLOGY   No results found.   PROCEDURES:  Critical Care performed: No  ..Incision and Drainage  Date/Time: 05/25/2022 4:21 PM  Performed by: 07/25/2022, PA-C Authorized by: Lissa Hoard, PA-C   Consent:  Consent obtained:  Verbal   Consent given by:  Patient   Risks, benefits, and alternatives were discussed: yes     Risks discussed:  Pain, incomplete drainage and bleeding   Alternatives discussed:  Delayed treatment Universal protocol:    Site/side marked: yes     Patient identity confirmed:  Verbally with patient Location:    Type:  Abscess   Size:  2   Location:  Upper extremity   Upper extremity location:  Arm   Arm location:  L upper arm (axilla) Pre-procedure details:    Skin preparation:  Povidone-iodine Sedation:    Sedation type:  None Anesthesia:    Anesthesia method:  Local infiltration   Local anesthetic:  Lidocaine 1% w/o epi Procedure type:    Complexity:  Simple Procedure details:    Ultrasound guidance: no     Needle aspiration: no     Incision types:  Stab incision   Incision depth:  Subcutaneous   Wound management:  Probed and deloculated and irrigated with saline   Drainage:  Purulent and  bloody   Drainage amount:  Scant   Wound treatment:  Wound left open   Packing materials:  1/2 in iodoform gauze   Amount 1/2" iodoform:  2 Post-procedure details:    Procedure completion:  Tolerated well, no immediate complications    MEDICATIONS ORDERED IN ED: Medications  pentafluoroprop-tetrafluoroeth (GEBAUERS) aerosol (has no administration in time range)  acetaminophen (TYLENOL) tablet 650 mg (650 mg Oral Given 05/25/22 1403)  lidocaine (PF) (XYLOCAINE) 1 % injection 5 mL (5 mLs Infiltration Given 05/25/22 1616)  sulfamethoxazole-trimethoprim (BACTRIM DS) 800-160 MG per tablet 1 tablet (1 tablet Oral Given 05/25/22 1615)     IMPRESSION / MDM / ASSESSMENT AND PLAN / ED COURSE  I reviewed the triage vital signs and the nursing notes.                              Differential diagnosis includes, but is not limited to, cellulitis, abscess, insect bite, folliculitis, acute ingrown hair  Patient's presentation is most consistent with acute complicated illness / injury requiring diagnostic workup.  Patient's diagnosis is consistent with axillary cellulitis/abscess. Patient will be discharged home with prescriptions for Bactrim. Patient is to follow up with primary provider as needed or otherwise directed. Patient is given ED precautions to return to the ED for any worsening or new symptoms.     FINAL CLINICAL IMPRESSION(S) / ED DIAGNOSES   Final diagnoses:  Abscess of axilla, left     Rx / DC Orders   ED Discharge Orders          Ordered    sulfamethoxazole-trimethoprim (BACTRIM DS) 800-160 MG tablet  2 times daily        05/25/22 1619             Note:  This document was prepared using Dragon voice recognition software and may include unintentional dictation errors.    Lissa Hoard, PA-C 05/25/22 Melrose Nakayama, MD 05/26/22 [phone]

## 2022-05-25 NOTE — Discharge Instructions (Addendum)
Keep the wound clean, dry, and covered.  May remove the ribbon packing in 3 days.  Take the prescription antibiotic as directed.  Follow with primary provider for interim wound check.  Return to ED if necessary.

## 2022-05-25 NOTE — ED Notes (Signed)
Pt gives verbal consent to DC 

## 2022-05-26 ENCOUNTER — Emergency Department
Admission: EM | Admit: 2022-05-26 | Discharge: 2022-05-26 | Disposition: A | Discharge status: Home / Self Care | Payer: BC Managed Care – PPO | Attending: Emergency Medicine | Admitting: Emergency Medicine

## 2022-05-26 ENCOUNTER — Other Ambulatory Visit: Payer: Self-pay

## 2022-05-26 ENCOUNTER — Encounter: Payer: Self-pay | Admitting: Emergency Medicine

## 2022-05-26 DIAGNOSIS — Y990 Civilian activity done for income or pay: Secondary | ICD-10-CM | POA: Insufficient documentation

## 2022-05-26 DIAGNOSIS — L02411 Cutaneous abscess of right axilla: Secondary | ICD-10-CM | POA: Diagnosis present

## 2022-05-26 MED ORDER — ACETAMINOPHEN 325 MG PO TABS
650.0000 mg | ORAL_TABLET | Freq: Once | ORAL | Status: AC
  Administered 2022-05-26: 650 mg via ORAL
  Filled 2022-05-26: qty 2

## 2022-05-26 MED ORDER — HYDROCODONE-ACETAMINOPHEN 5-325 MG PO TABS: 1.0000 | ORAL_TABLET | Freq: Four times a day (QID) | ORAL | 0 refills | Status: DC | PRN

## 2022-05-26 MED ORDER — CLINDAMYCIN PHOSPHATE 600 MG/50ML IV SOLN
600.0000 mg | Freq: Once | INTRAVENOUS | Status: AC
  Administered 2022-05-26: 600 mg via INTRAVENOUS
  Filled 2022-05-26: qty 50

## 2022-05-26 MED ORDER — CLINDAMYCIN PHOSPHATE 600 MG/4ML IJ SOLN
600.0000 mg | Freq: Once | INTRAMUSCULAR | Status: DC
  Filled 2022-05-26: qty 4

## 2022-05-26 NOTE — ED Notes (Signed)
See triage note  Presents with possible abscess under both arms  States he was seen yesterday for same    States he did have some diarrhea yesterday not today  was febrile at home but afebrile on arrival

## 2022-05-26 NOTE — Discharge Instructions (Signed)
Continue taking antibiotics that you received yesterday until you have completely finished..  Began using warm moist compresses to the area frequently to both the right and left armpits.  A prescription for pain medication was sent to the pharmacy to take only as needed.  Your primary care provider can also refer you to a surgeon if these areas continue to have problems and need to be completely removed.

## 2022-05-26 NOTE — ED Triage Notes (Signed)
Pt via POV from home. Pt was seen here yesterday for an abscess under his L underarm, states that he was seen and sent home yesterday with antibiotic but now he has another abscess on his R underarm. Pt has only taken 2 doses of his antibiotic. States taht Pt is A&OX4 and NAD

## 2022-05-26 NOTE — ED Provider Notes (Signed)
[facility] Provider Note    Event Date/Time   First MD Initiated Contact with Patient 05/26/22 1137     (approximate)   History   Abscess   HPI  Steven Hughes is a 46 y.o. male   presents to the ED with complaint of abscesses to his right axilla.  Patient was seen yesterday for 1 abscess in his left axilla and was placed on Bactrim DS.  Area was I&D with a scant amount of purulent material noted.  Patient has had 2 doses of his antibiotic.      Physical Exam   Triage Vital Signs: ED Triage Vitals  Enc Vitals Group     BP 05/26/22 1047 (!) 144/83     Pulse Rate 05/26/22 1047 86     Resp 05/26/22 1047 20     Temp 05/26/22 1047 98.7 F (37.1 C)     Temp Source 05/26/22 1047 Oral     SpO2 05/26/22 1047 98 %     Weight 05/26/22 1048 185 lb (83.9 kg)     Height 05/26/22 1048 5\' 10"  (1.778 m)     Head Circumference --      Peak Flow --      Pain Score 05/26/22 1048 9     Pain Loc --      Pain Edu? --      Excl. in GC? --     Most recent vital signs: Vitals:   05/26/22 1047 05/26/22 1425  BP: (!) 144/83 138/87  Pulse: 86 76  Resp: 20   Temp: 98.7 F (37.1 C)   SpO2: 98% 99%     General: Awake, no distress.  CV:  Good peripheral perfusion.  Resp:  Normal effort.  Abd:  No distention.  Other:  I&D site from yesterday in the left axilla was checked with little to no drainage in the area.  Right axilla has 2 separate 1 cm tender nodules without erythema or drainage.   ED Results / Procedures / Treatments   Labs (all labs ordered are listed, but only abnormal results are displayed) Labs Reviewed - No data to display     PROCEDURES:  Critical Care performed:   Procedures   MEDICATIONS ORDERED IN ED: Medications  acetaminophen (TYLENOL) tablet 650 mg (650 mg Oral Given 05/26/22 1243)  clindamycin (CLEOCIN) IVPB 600 mg (0 mg Intravenous Stopped 05/26/22 1415)     IMPRESSION / MDM / ASSESSMENT AND PLAN / ED COURSE  I  reviewed the triage vital signs and the nursing notes.   Differential diagnosis includes, but is not limited to, hidradenitis, subcutaneous abscesses.  46 year old male presents to the ED today after being seen in the ED yesterday for an abscess to his left axilla.  Today he noticed 2 areas in his right axilla that are worrisome.  Patient has only taken 2 antibiotics from his prescription yesterday and is not really considered a outpatient antibiotic failure at this point.  Patient is afebrile while in the ED.  He is quite anxious.  Clindamycin was given IV and patient is encouraged to use warm moist compresses to the area frequently.  A prescription for hydrocodone was sent to the pharmacy to take only as needed for moderate pain.  He is aware that he cannot drive or operate machinery while taking this medication.  He will also continue taking the Bactrim DS that he was prescribed yesterday.      Patient's presentation is most consistent with acute, uncomplicated  illness.  FINAL CLINICAL IMPRESSION(S) / ED DIAGNOSES   Final diagnoses:  Abscess of axilla, right     Rx / DC Orders   ED Discharge Orders          Ordered    HYDROcodone-acetaminophen (NORCO/VICODIN) 5-325 MG tablet  Every 6 hours PRN        05/26/22 1259             Note:  This document was prepared using Dragon voice recognition software and may include unintentional dictation errors.   Tommi Rumps, PA-C 05/26/22 1529    Sharman Cheek, MD 05/26/22 1924

## 2022-06-02 ENCOUNTER — Emergency Department
Admission: EM | Admit: 2022-06-02 | Discharge: 2022-06-02 | Disposition: A | Discharge status: Home / Self Care | Payer: BC Managed Care – PPO | Attending: Emergency Medicine | Admitting: Emergency Medicine

## 2022-06-02 DIAGNOSIS — I10 Essential (primary) hypertension: Secondary | ICD-10-CM | POA: Diagnosis not present

## 2022-06-02 DIAGNOSIS — J45909 Unspecified asthma, uncomplicated: Secondary | ICD-10-CM | POA: Insufficient documentation

## 2022-06-02 DIAGNOSIS — L732 Hidradenitis suppurativa: Secondary | ICD-10-CM | POA: Diagnosis not present

## 2022-06-02 DIAGNOSIS — J449 Chronic obstructive pulmonary disease, unspecified: Secondary | ICD-10-CM | POA: Insufficient documentation

## 2022-06-02 DIAGNOSIS — L02411 Cutaneous abscess of right axilla: Secondary | ICD-10-CM | POA: Diagnosis present

## 2022-06-02 LAB — BASIC METABOLIC PANEL
Anion gap: 8 (ref 5–15)
BUN: 15 mg/dL (ref 6–20)
CO2: 25 mmol/L (ref 22–32)
Calcium: 9.1 mg/dL (ref 8.9–10.3)
Chloride: 102 mmol/L (ref 98–111)
Creatinine, Ser: 1.38 mg/dL — ABNORMAL HIGH (ref 0.61–1.24)
GFR, Estimated: >60 mL/min (ref >60)
Glucose, Bld: 113 mg/dL — ABNORMAL HIGH (ref 70–99)
Potassium: 4 mmol/L (ref 3.5–5.1)
Sodium: 135 mmol/L (ref 135–145)

## 2022-06-02 LAB — CBC WITH DIFFERENTIAL/PLATELET
Abs Immature Granulocytes: 0.02 10*3/uL (ref 0.00–0.07)
Basophils Absolute: 0 10*3/uL (ref 0.0–0.1)
Basophils Relative: 1 %
Eosinophils Absolute: 0.1 10*3/uL (ref 0.0–0.5)
Eosinophils Relative: 1 %
HCT: 41.9 % (ref 39.0–52.0)
Hemoglobin: 14.4 g/dL (ref 13.0–17.0)
Immature Granulocytes: 0 %
Lymphocytes Relative: 26 %
Lymphs Abs: 1.5 10*3/uL (ref 0.7–4.0)
MCH: 30.9 pg (ref 26.0–34.0)
MCHC: 34.4 g/dL (ref 30.0–36.0)
MCV: 89.9 fL (ref 80.0–100.0)
Monocytes Absolute: 0.6 10*3/uL (ref 0.1–1.0)
Monocytes Relative: 11 %
Neutro Abs: 3.5 10*3/uL (ref 1.7–7.7)
Neutrophils Relative %: 61 %
Platelets: 360 10*3/uL (ref 150–400)
RBC: 4.66 MIL/uL (ref 4.22–5.81)
RDW: 11.9 % (ref 11.5–15.5)
WBC: 5.7 10*3/uL (ref 4.0–10.5)
nRBC: 0 % (ref 0.0–0.2)

## 2022-06-02 NOTE — Discharge Instructions (Addendum)
Your blood work is reassuring.  Please call your primary care provider and schedule follow-up appointment if your symptoms of concern are not resolving.  Please finish your antibiotics as prescribed.

## 2022-06-02 NOTE — ED Provider Notes (Signed)
[facility] Provider Note    Event Date/Time   First MD Initiated Contact with Patient 06/02/22 2006     (approximate)   History   Fever   HPI  Steven Hughes is a 46 y.o. male presents to the emergency department for treatment and evaluation of abscesses under both arms.  He is currently on Bactrim and states the areas have gotten smaller and no longer have a foul odor to them but they have not completely gone away.  He has felt that his fever has been intermittent even while taking the antibiotics.   Past Medical History:  Diagnosis Date   Anemia    ONLY AS A CHILD   Arrhythmia    Arthritis    Asthma    Complication of anesthesia    DURING DENTAL PROCEDURES NOVACAINE CAUSES MORE PVC'S    COPD (chronic obstructive pulmonary disease) (HCC)    DEVELOPED THIS 5 YEARS AFTER BEING [facility]   Dyspnea    PT STATES THIS WILL HAPPEN OCC WITH ENVIRONMENT (MOLD, DUST ETC) SINCE BEING [facility]-PT STATES HE CAN WALK 5 MILES WITHOUT GETTING SOB OR HAVING CP   Headache    MIGRAINES   Hypertension    PVC (premature ventricular contraction)      Physical Exam   Triage Vital Signs: ED Triage Vitals  Enc Vitals Group     BP 06/02/22 1910 (!) 144/103     Pulse Rate 06/02/22 1910 64     Resp 06/02/22 1910 18     Temp 06/02/22 1910 98.7 F (37.1 C)     Temp Source 06/02/22 1910 Oral     SpO2 06/02/22 1910 98 %     Weight 06/02/22 1911 195 lb (88.5 kg)     Height --      Head Circumference --      Peak Flow --      Pain Score 06/02/22 1911 5     Pain Loc --      Pain Edu? --      Excl. in GC? --     Most recent vital signs: Vitals:   06/02/22 1910  BP: (!) 144/103  Pulse: 64  Resp: 18  Temp: 98.7 F (37.1 C)  SpO2: 98%    General: Awake, no distress.  CV:  Good peripheral perfusion.  Resp:  Normal effort.  Abd:  No distention.  Other:  Multiple cystic, skin colored lesions in bilateral axilla without  surrounding erythema.  No areas of fluctuance.   ED Results / Procedures / Treatments   Labs (all labs ordered are listed, but only abnormal results are displayed) Labs Reviewed  BASIC METABOLIC PANEL - Abnormal; Notable for the following components:      Result Value   Glucose, Bld 113 (*)    Creatinine, Ser 1.38 (*)    All other components within normal limits  CBC WITH DIFFERENTIAL/PLATELET     EKG  Not indicated   RADIOLOGY  Not indicated  I have independently reviewed and interpreted imaging as well as reviewed report from radiology.  PROCEDURES:  Critical Care performed: No  Procedures   MEDICATIONS ORDERED IN ED:  Medications - No data to display   IMPRESSION / MDM / ASSESSMENT AND PLAN / ED COURSE   I reviewed the triage vital signs and the nursing notes.  Differential diagnosis includes, but is not limited to: Abscess, cellulitis, hidradenitis.  Patient's presentation is most consistent  with acute illness / injury with system symptoms.  46 year old male presenting to the emergency department for treatment and evaluation of lesions underneath both axilla.  See HPI for further details.  Patient was evaluated here for the same on August 1 and 2.  Incision and drainage was performed at the first visit.  At the second visit he received an IV dose of clindamycin and was advised to finish the Bactrim that was already prescribed.  Today, there is no need for any additional antibiotics.  Areas appear that they are resolving.  Patient mentions having had fevers at home, however he has not had a fever here and vital signs are normal including heart rate and temperature.  Patient will be advised to follow-up with his primary care provider for any symptoms that is not improving after he finishes his antibiotics.     FINAL CLINICAL IMPRESSION(S) / ED DIAGNOSES   Final diagnoses:  Hidradenitis suppurativa of left axilla  Hidradenitis suppurativa of right axilla      Rx / DC Orders   ED Discharge Orders     None        Note:  This document was prepared using Dragon voice recognition software and may include unintentional dictation errors.   Chinita Pester, FNP 06/02/22 2055    Phineas Semen, MD 06/02/22 2110

## 2022-06-02 NOTE — ED Triage Notes (Signed)
Pt here in ED due to the his previous visit with the abscess under his left and right arms. Pt sts that he was given abx but it has not helped. Pt sts that his fever is coming and going.

## 2022-06-02 NOTE — ED Provider Triage Note (Signed)
  Emergency Medicine Provider Triage Evaluation Note  Steven Hughes , a 45 y.o.male,  was evaluated in triage.  Pt complains of fever, fatigue x 2 days.  Patient states that he was recently treated for abscess with antibiotics.  He has 2 days left of his Bactrim, however has been experiencing fever and fatigue, as well as a sore throat.   Review of Systems  Positive: Fever, fatigue, sore throat Negative: Denies cough, congestion, abdominal pain  Physical Exam  There were no vitals filed for this visit. Gen:   Awake, no distress   Resp:  Normal effort  MSK:   Moves extremities without difficulty  Other:    Medical Decision Making  Given the patient's initial medical screening exam, the following diagnostic evaluation has been ordered. The patient will be placed in the appropriate treatment space, once one is available, to complete the evaluation and treatment. I have discussed the plan of care with the patient and I have advised the patient that an ED physician or mid-level practitioner will reevaluate their condition after the test results have been received, as the results may give them additional insight into the type of treatment they may need.    Diagnostics: Labs, respiratory panel, strep PCR  Treatments: none immediately   Varney Daily, [place] 06/02/22 1901

## 2022-06-16 ENCOUNTER — Encounter: Payer: Self-pay | Admitting: Surgery

## 2022-06-16 ENCOUNTER — Ambulatory Visit: Payer: BC Managed Care – PPO | Admitting: Surgery

## 2022-06-16 VITALS — BP 139/90 | HR 60 | Temp 98.5°F | Ht 70.0 in | Wt 198.0 lb

## 2022-06-16 DIAGNOSIS — R590 Localized enlarged lymph nodes: Secondary | ICD-10-CM | POA: Diagnosis not present

## 2022-06-16 NOTE — Patient Instructions (Addendum)
If you have any concerns or questions, please feel free to call our office. Follow up as needed.   Lymphedema  Lymphedema is swelling that is caused by the abnormal collection of lymph in the tissues under the skin. Lymph is excess fluid from the tissues in your body that is removed through the lymphatic system. This system is part of your body's defense system (immune system) and includes lymph nodes and lymph vessels. The lymph vessels collect and carry the excess fluid, fats, proteins, and waste from the tissues of the body to the bloodstream. This system also works to clean and remove bacteria and waste products from the body. Lymphedema occurs when the lymphatic system is blocked. When the lymph vessels or lymph nodes are blocked or damaged, lymph does not drain properly. This causes an abnormal buildup of lymph, which leads to swelling in the affected area. This may include the trunk area, or an arm or leg. Lymphedema cannot be cured by medicines, but various methods can be used to help reduce the swelling. What are the causes? The cause of this condition depends on the type of lymphedema that you have. Primary lymphedema is caused by the absence of lymph vessels or having abnormal lymph vessels at birth. Secondary lymphedema occurs when lymph vessels are blocked or damaged. Secondary lymphedema is more common. Common causes of lymph vessel blockage include: Skin infection, such as cellulitis. Infection by parasites (filariasis). Injury. Radiation therapy. Cancer. Formation of scar tissue. Surgery. What are the signs or symptoms? Symptoms of this condition include: Swelling of the arm or leg. A heavy or tight feeling in the arm or leg. Swelling of the feet, toes, or fingers. Shoes or rings may fit more tightly than before. Redness of the skin over the affected area. Limited movement of the affected limb. Sensitivity to touch or discomfort in the affected limb. How is this  diagnosed? This condition may be diagnosed based on: Your symptoms and medical history. A physical exam. Bioimpedance spectroscopy. In this test, painless electrical currents are used to measure fluid levels in your body. Imaging tests, such as: MRI. CT scan. Duplex ultrasound. This test uses sound waves to produce images of the vessels and the blood flow on a screen. Lymphoscintigraphy. In this test, a low dose of a radioactive substance is injected to trace the flow of lymph through your lymph vessels. Lymphangiography. In this test, a contrast dye is injected into the lymph vessel to help show blockages. How is this treated?  If an underlying condition is causing the lymphedema, that condition will be treated. For example, antibiotic medicines may be used to treat an infection. Treatment for this condition will depend on the cause of your lymphedema. Treatment may include: Complete decongestive therapy (CDT). This is done by a certified lymphedema therapist to reduce fluid congestion. This therapy includes: Skin care. Compression wrapping of the affected area. Manual lymph drainage. This is a special massage technique that promotes lymph drainage out of a limb. Specific exercises. Certain exercises can help fluid move out of the affected limb. Compression. Various methods may be used to apply pressure to the affected limb to reduce the swelling. They include: Wearing compression stockings or sleeves on the affected limb. Wrapping the affected limb with special bandages. Surgery. This is usually done for severe cases only. For example, surgery may be done if you have trouble moving the limb or if the swelling does not get better with other treatments. Follow these instructions at home: Self-care The  affected area is more likely to become injured or infected. Take these steps to help prevent infection: Keep the affected area clean and dry. Use approved creams or lotions to keep the skin  moisturized. Protect your skin from cuts: Use gloves while cooking or gardening. Do not walk barefoot. If you shave the affected area, use an Neurosurgeon. Do not wear tight clothes, shoes, or jewelry. Eat a healthy diet that includes a lot of fruits and vegetables. Activity Do exercises as told by your health care provider. Do not sit with your legs crossed. When possible, keep the affected limb raised (elevated) above the level of your heart. Avoid carrying things with an arm that is affected by lymphedema. General instructions Wear compression stockings or sleeves as told by your health care provider. Note any changes in size of the affected limb. You may be instructed to take regular measurements and keep track of them. Take over-the-counter and prescription medicines only as told by your health care provider. If you were prescribed an antibiotic medicine, take or apply it as told by your health care provider. Do not stop using the antibiotic even if you start to feel better or if your condition improves. Do not use heating pads or ice packs on the affected area. Avoid having blood draws, IV insertions, or blood pressure checks on the affected limb. Keep all follow-up visits. This is important. Contact a health care provider if you: Continue to have swelling in your limb. Have fluid leaking from the skin of your swollen limb. Have a cut that does not heal. Have redness or pain in the affected area. Develop purplish spots, rash, blisters, or sores (lesions) on your affected limb. Get help right away if you: Have new swelling in your limb that starts suddenly. Have shortness of breath or chest pain. Have a fever or chills. These symptoms may represent a serious problem that is an emergency. Do not wait to see if the symptoms will go away. Get medical help right away. Call your local emergency services (911 in the [place] Do not drive yourself to the hospital. Summary Lymphedema is  swelling that is caused by the abnormal collection of lymph in the tissues under the skin. Lymph is fluid from the tissues in your body that is removed through the lymphatic system. This system collects and carries excess fluid, fats, proteins, and wastes from the tissues of the body to the bloodstream. Lymphedema causes swelling, pain, and redness in the affected area. This may include the trunk area, or an arm or leg. Treatment for this condition may depend on the cause of your lymphedema. Treatment may include treating the underlying cause, complete decongestive therapy (CDT), compression methods, or surgery. This information is not intended to replace advice given to you by your health care provider. Make sure you discuss any questions you have with your health care provider. Document Revised: 08/06/2020 Document Reviewed: 08/06/2020 Elsevier Patient Education  2023 [employer].

## 2022-06-16 NOTE — Progress Notes (Signed)
06/16/2022  History of Present Illness: Steven Hughes is a 46 y.o. male presenting for evaluation of bilateral axillary bumps.  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  He feels that he was very careful about cleaning and washing his hands but shortly after he started developing swelling in bilateral axillary areas as well as fevers and chills.  He presented to his doctor and initially was told to go to the emergency room as there was no improvement.  He was seen in the ER on 05/25/2022 at which time he was diagnosed with a left axillary abscess and had an I&D done and was given Bactrim.  He then presented the next day on 05/26/2022 with a possible abscess in the right axilla.  He had just started the Bactrim so he was continued that medication.  He then presented again to the emergency room 06/02/2022 because he had been having fevers despite of antibiotics.  However the areas in both axilla have been improving without any further drainage.  No further antibiotics were needed at the time as the axillary areas were improving and resolving.  He was added a possible diagnosis of hidradenitis.  The patient reports that she has not had any further fever since that visit to the emergency room.  He completed the antibiotic course and has noted that the swelling in both areas has almost resolved.  There is only 1 small area that he can feel in the left axilla which is in the area that the I&D was done.  The patient reports that he had a similar episode of swelling but in the bilateral groin areas after a wound infection in his right leg.  This resolved on its own also with the biotic management.  Past Medical History: Past Medical History:  Diagnosis Date   Anemia    ONLY AS A CHILD   Arrhythmia    Arthritis    Asthma    Complication of anesthesia    DURING DENTAL PROCEDURES  NOVACAINE CAUSES MORE PVC'S    COPD (chronic obstructive pulmonary disease) (HCC)    DEVELOPED THIS 5 YEARS AFTER BEING [facility]   Dyspnea    PT STATES THIS WILL HAPPEN OCC WITH ENVIRONMENT (MOLD, DUST ETC) SINCE BEING [facility]-PT STATES HE CAN WALK 5 MILES WITHOUT GETTING SOB OR HAVING CP   Headache    MIGRAINES   Hypertension    PVC (premature ventricular contraction)      Past Surgical History: Past Surgical History:  Procedure Laterality Date   ABLATION     HEART ABLATION X 2   SHOULDER SURGERY Bilateral    UMBILICAL HERNIA REPAIR N/A 07/18/2020   Procedure: HERNIA REPAIR UMBILICAL ADULT;  Surgeon: Ronny Bacon, MD;  Location: [facility] ORS;  Service: General;  Laterality: N/A;   WISDOM TOOTH EXTRACTION      Home Medications: Prior to Admission medications   Medication Sig Start Date End Date Taking? Authorizing Provider  acetaminophen (TYLENOL) 500 MG tablet Take 1,000 mg by mouth every 6 (six) hours as needed.   Yes [provider]  albuterol (VENTOLIN HFA) 108 (90 Base) MCG/ACT inhaler Inhale 2 puffs into the lungs every 4 (four) hours as needed for wheezing or shortness of breath.  09/27/19  Yes [provider]  b complex vitamins tablet Take 1 tablet by mouth daily.  Yes [provider]  fluticasone (FLONASE) 50 MCG/ACT nasal spray Place 2 sprays into both nostrils daily as needed for allergies.  09/27/19  Yes [provider]  hydrOXYzine (VISTARIL) 25 MG capsule Take 25 mg by mouth at bedtime.  04/09/19  Yes [provider]  Melatonin 10 MG TABS Take 10 mg by mouth at bedtime.   Yes [provider]  metoprolol tartrate (LOPRESSOR) 25 MG tablet Take 25 mg by mouth at bedtime.    Yes [provider]  Tadalafil 2.5 MG TABS Take 2.5 mg by mouth daily.  02/07/20  Yes [provider]    Allergies: Allergies  Allergen Reactions   Caffeine     EXACERBATES PVC'S   Cucumber Extract  Nausea And Vomiting   Gluten Meal Diarrhea    Constipation. Bloating, inflammation    Other Other (See Comments)    Cilantro Stomach pain/ Bloating    Strawberry (Diagnostic) Hives   Strawberry Extract Hives   Watermelon [Citrullus Vulgaris] Nausea And Vomiting   Morphine And Related Palpitations   Novocain [Procaine] Palpitations    Review of Systems: Review of Systems  Constitutional:  Negative for chills and fever.  Respiratory:  Negative for shortness of breath.   Cardiovascular:  Negative for chest pain.  Gastrointestinal:  Negative for abdominal pain, nausea and vomiting.  Skin:  Negative for rash.    Physical Exam BP (!) 139/90   Pulse 60   Temp 98.5 F (36.9 C) (Oral)   Ht 5' 10"  (1.778 m)   Wt 198 lb (89.8 kg)   SpO2 98%   BMI 28.41 kg/m  CONSTITUTIONAL: No acute distress, well-nourished HEENT:  Normocephalic, atraumatic, extraocular motion intact. RESPIRATORY:  Lungs are clear, and breath sounds are equal bilaterally. Normal respiratory effort without pathologic use of accessory muscles. CARDIOVASCULAR: Heart is regular without murmurs, gallops, or rubs. SKIN: Right axilla has no visible skin changes and no palpable lymphadenopathy.  There is no blackheads or any swollen pores.  Left axilla also has no visible skin changes except for the I&D site which is healing well.  There is mild firmness surrounding this healing wound which is likely related to scar tissue formation.  There is no other skin changes like blackheads or any swollen portions. NEUROLOGIC:  Motor and sensation is grossly normal.  Cranial nerves are grossly intact. PSYCH:  Alert and oriented to person, place and time. Affect is normal.   Assessment and Plan: This is a 46 y.o. male with likely bilateral reactive axillary lymphadenopathy.  - Discussed with the patient that on exam today I do not have any clear evidence that this could really be hidradenitis.  Typically once an area of hidradenitis  develops, this does not resolve or go away on its own and there is persistent changes to the skin and scarring that progresses due to this consistent inflammation.  All antibiotics may decrease the inflammation or infection of hidradenitis, there is still scarring and skin changes that persist. - The patient had a previous exposure to potential contaminants from rat feces and the swelling underneath both axilla started after that exposure.  I think more likely this was related to her reactive lymphadenopathy due to either bacteria or virus exposure from these contaminants.  Now he has been treated with antibiotics, the inflammation is improving.  He has had no further fevers and his fatigue is improving as well.  I do not feel any enlarged lymph nodes at this time and there are no other  skin changes noticeable.  Also, the patient reports that he had a similar episode as a child where the lymphadenopathy happened in both groins. - Discussed with the patient unfortunately we would not be able to tell him if he truly had lymphadenopathy or not as now the area has resolved.  However, given strict return precautions particularly either area starts getting swollen again or if there are any skin changes. - Otherwise, patient will follow-up as needed.  I spent 20 minutes dedicated to the care of this patient on the date of this encounter to include pre-visit review of records, face-to-face time with the patient discussing diagnosis and management, and any post-visit coordination of care.   Melvyn Neth, [facility]

## 2022-09-25 ENCOUNTER — Ambulatory Visit
Admission: EM | Admit: 2022-09-25 | Discharge: 2022-09-25 | Disposition: A | Discharge status: Home / Self Care | Payer: BC Managed Care – PPO | Attending: Emergency Medicine | Admitting: Emergency Medicine

## 2022-09-25 DIAGNOSIS — J441 Chronic obstructive pulmonary disease with (acute) exacerbation: Secondary | ICD-10-CM | POA: Diagnosis not present

## 2022-09-25 DIAGNOSIS — Z760 Encounter for issue of repeat prescription: Secondary | ICD-10-CM

## 2022-09-25 MED ORDER — AMOXICILLIN-POT CLAVULANATE 875-125 MG PO TABS: 1.0000 | ORAL_TABLET | Freq: Two times a day (BID) | ORAL | 0 refills | Status: AC

## 2022-09-25 MED ORDER — PREDNISONE 10 MG PO TABS: 40.0000 mg | ORAL_TABLET | Freq: Every day | ORAL | 0 refills | Status: AC

## 2022-09-25 MED ORDER — ALBUTEROL SULFATE HFA 108 (90 BASE) MCG/ACT IN AERS: 1.0000 | INHALATION_SPRAY | Freq: Four times a day (QID) | RESPIRATORY_TRACT | 0 refills | Status: AC | PRN

## 2022-09-25 NOTE — ED Provider Notes (Signed)
Steven Hughes    CSN: [id number] Arrival date & time: 09/25/22  1151      History   Chief Complaint Chief Complaint  Patient presents with   Sore Throat   Cough    HPI Steven Hughes is a 46 y.o. male.  Patient presents with congestion, cough, fatigue, chills, body aches x 3 months.  He was seen at [facility] and improved with treatment but his symptoms returned 4-5 weeks ago.  He has not seen his PCP.  He denies fever, shortness of breath, or other symptoms.  He needs a refill on his albuterol inhaler.  His medical history includes COPD and hypertension.  Patient was seen at [facility] clinic on 07/30/2022; diagnosed with sinusitis, bronchitis; treated with Tessalon Perles, prednisone, Zithromax.  The history is provided by the patient and medical records.    Past Medical History:  Diagnosis Date   Anemia    ONLY AS A CHILD   Arrhythmia    Arthritis    Asthma    Complication of anesthesia    DURING DENTAL PROCEDURES NOVACAINE CAUSES MORE PVC'S    COPD (chronic obstructive pulmonary disease) (HCC)    DEVELOPED THIS 5 YEARS AFTER BEING [facility]   Dyspnea    PT STATES THIS WILL HAPPEN OCC WITH ENVIRONMENT (MOLD, DUST ETC) SINCE BEING [facility] ON [facility]-PT STATES HE CAN WALK 5 MILES WITHOUT GETTING SOB OR HAVING CP   Headache    MIGRAINES   Hypertension    PVC (premature ventricular contraction)     There are no problems to display for this patient.   Past Surgical History:  Procedure Laterality Date   ABLATION     HEART ABLATION X 2   SHOULDER SURGERY Bilateral    UMBILICAL HERNIA REPAIR N/A 07/18/2020   Procedure: HERNIA REPAIR UMBILICAL ADULT;  Surgeon: Campbell Lerner, MD;  Location: [facility] ORS;  Service: General;  Laterality: N/A;   WISDOM TOOTH EXTRACTION         Home Medications    Prior to Admission medications   Medication Sig Start Date End Date Taking? Authorizing Provider  albuterol (VENTOLIN HFA) 108 (90 Base) MCG/ACT  inhaler Inhale 1-2 puffs into the lungs every 6 (six) hours as needed. 09/25/22  Yes Mickie Bail, NP  amoxicillin-clavulanate (AUGMENTIN) 875-125 MG tablet Take 1 tablet by mouth every 12 (twelve) hours for 7 days. 09/25/22 10/02/22 Yes Mickie Bail, NP  predniSONE (DELTASONE) 10 MG tablet Take 4 tablets (40 mg total) by mouth daily for 5 days. 09/25/22 09/30/22 Yes Mickie Bail, NP  acetaminophen (TYLENOL) 500 MG tablet Take 1,000 mg by mouth every 6 (six) hours as needed.    [provider]  b complex vitamins tablet Take 1 tablet by mouth daily.    [provider]  fluticasone (FLONASE) 50 MCG/ACT nasal spray Place 2 sprays into both nostrils daily as needed for allergies.  09/27/19   [provider]  hydrOXYzine (VISTARIL) 25 MG capsule Take 25 mg by mouth at bedtime.  04/09/19   [provider]  Melatonin 10 MG TABS Take 10 mg by mouth at bedtime.    [provider]  metoprolol tartrate (LOPRESSOR) 25 MG tablet Take 25 mg by mouth at bedtime.     [provider]  Tadalafil 2.5 MG TABS Take 2.5 mg by mouth daily.  02/07/20   [provider]    Family History Family History  Problem Relation Age of  Onset   Hepatitis C Mother    Healthy Mother    Parkinson's disease Father    Healthy Father    Hypertension Sister    Hypertension Paternal Grandmother    Hypertension Paternal Grandfather     Social History Social History   Tobacco Use   Smoking status: Never   Smokeless tobacco: Never  Vaping Use   Vaping Use: Never used  Substance Use Topics   Alcohol use: Yes    Comment: OCC BEER ON WEEKENDS   Drug use: Never     Allergies   Caffeine, Cucumber extract, Gluten meal, Other, Strawberry (diagnostic), Strawberry extract, Watermelon [citrullus vulgaris], Morphine and related, and Novocain [procaine]   Review of Systems Review of Systems  Constitutional:  Positive for chills and fatigue. Negative for fever.  HENT:   Positive for congestion. Negative for ear pain and sore throat.   Respiratory:  Positive for cough. Negative for shortness of breath.   Cardiovascular:  Negative for chest pain and palpitations.  Skin:  Negative for rash.  All other systems reviewed and are negative.    Physical Exam Triage Vital Signs ED Triage Vitals [09/25/22 1310]  Enc Vitals Group     BP      Pulse Rate 77     Resp 18     Temp 98.2 F (36.8 C)     Temp src      SpO2 96 %     Weight      Height      Head Circumference      Peak Flow      Pain Score      Pain Loc      Pain Edu?      Excl. in GC?    No data found.  Updated Vital Signs BP (!) 143/74   Pulse 77   Temp 98.2 F (36.8 C)   Resp 18   Ht 5\' 10"  (1.778 m)   Wt 180 lb (81.6 kg)   SpO2 96%   BMI 25.83 kg/m   Visual Acuity Right Eye Distance:   Left Eye Distance:   Bilateral Distance:    Right Eye Near:   Left Eye Near:    Bilateral Near:     Physical Exam Vitals and nursing note reviewed.  Constitutional:      General: He is not in acute distress.    Appearance: Normal appearance. He is well-developed. He is not ill-appearing.  HENT:     Right Ear: Tympanic membrane normal.     Left Ear: Tympanic membrane normal.     Nose: Nose normal.     Mouth/Throat:     Mouth: Mucous membranes are moist.     Pharynx: Oropharynx is clear.  Cardiovascular:     Rate and Rhythm: Normal rate and regular rhythm.     Heart sounds: Normal heart sounds.  Pulmonary:     Effort: Pulmonary effort is normal. No respiratory distress.     Breath sounds: Normal breath sounds. No wheezing.  Musculoskeletal:     Cervical back: Neck supple.  Skin:    General: Skin is warm and dry.  Neurological:     Mental Status: He is alert.  Psychiatric:        Mood and Affect: Mood normal.        Behavior: Behavior normal.      UC Treatments / Results  Labs (all labs ordered are listed, but only abnormal results are displayed) Labs Reviewed - No  data  to display  EKG   Radiology No results found.  Procedures Procedures (including critical care time)  Medications Ordered in UC Medications - No data to display  Initial Impression / Assessment and Plan / UC Course  I have reviewed the triage vital signs and the nursing notes.  Pertinent labs & imaging results that were available during my care of the patient were reviewed by me and considered in my medical decision making (see chart for details).    COPD exacerbation, medication refill (albuterol inhaler).  No respiratory distress, O2 sat 96% on room air.  Patient has been symptomatic for several weeks.  Treating today with prednisone, albuterol inhaler, and Augmentin.  Instructed him to follow up with his PCP.  Education provided on COPD exacerbation.  He agrees to plan of care.   Final Clinical Impressions(s) / UC Diagnoses   Final diagnoses:  COPD exacerbation (HCC)  Medication refill     Discharge Instructions      Take the prednisone and Augmentin as directed.  Use the albuterol inhaler as directed.  Follow up with your primary care provider on Monday.        ED Prescriptions     Medication Sig Dispense Auth. Provider   amoxicillin-clavulanate (AUGMENTIN) 875-125 MG tablet Take 1 tablet by mouth every 12 (twelve) hours for 7 days. 14 tablet Wendee Beavers H, NP   predniSONE (DELTASONE) 10 MG tablet Take 4 tablets (40 mg total) by mouth daily for 5 days. 20 tablet Mickie Bail, NP   albuterol (VENTOLIN HFA) 108 (90 Base) MCG/ACT inhaler Inhale 1-2 puffs into the lungs every 6 (six) hours as needed. 18 g Mickie Bail, NP      PDMP not reviewed this encounter.   Mickie Bail, NP 09/25/22 1357

## 2022-09-25 NOTE — Discharge Instructions (Addendum)
Take the prednisone and Augmentin as directed.  Use the albuterol inhaler as directed.  Follow up with your primary care provider on Monday.

## 2022-09-25 NOTE — ED Triage Notes (Addendum)
Patient to Urgent Care with complaints of chills/ body aches. Nasal congestion/ fatigue/ asthma, sore throat. Symptoms started three weeks ago.   Reports cough x2 months on and off, at times dry and sometimes productive with clear mucus.   States his albuterol inhaler is empty.

## 2022-09-27 ENCOUNTER — Other Ambulatory Visit: Payer: Self-pay

## 2022-09-27 ENCOUNTER — Emergency Department
Admission: EM | Admit: 2022-09-27 | Discharge: 2022-09-27 | Disposition: A | Discharge status: Home / Self Care | Payer: BC Managed Care – PPO | Attending: Emergency Medicine | Admitting: Emergency Medicine

## 2022-09-27 ENCOUNTER — Emergency Department: Payer: BC Managed Care – PPO

## 2022-09-27 ENCOUNTER — Encounter: Payer: Self-pay | Admitting: Emergency Medicine

## 2022-09-27 DIAGNOSIS — J101 Influenza due to other identified influenza virus with other respiratory manifestations: Secondary | ICD-10-CM | POA: Diagnosis not present

## 2022-09-27 DIAGNOSIS — J449 Chronic obstructive pulmonary disease, unspecified: Secondary | ICD-10-CM | POA: Insufficient documentation

## 2022-09-27 DIAGNOSIS — J45909 Unspecified asthma, uncomplicated: Secondary | ICD-10-CM | POA: Diagnosis not present

## 2022-09-27 DIAGNOSIS — R059 Cough, unspecified: Secondary | ICD-10-CM | POA: Diagnosis present

## 2022-09-27 DIAGNOSIS — Z20822 Contact with and (suspected) exposure to covid-19: Secondary | ICD-10-CM | POA: Diagnosis not present

## 2022-09-27 DIAGNOSIS — I10 Essential (primary) hypertension: Secondary | ICD-10-CM | POA: Insufficient documentation

## 2022-09-27 LAB — RESP PANEL BY RT-PCR (FLU A&B, COVID) ARPGX2
Influenza A by PCR: NEGATIVE
Influenza B by PCR: POSITIVE — AB
SARS Coronavirus 2 by RT PCR: NEGATIVE

## 2022-09-27 MED ORDER — ONDANSETRON 4 MG PO TBDP
4.0000 mg | ORAL_TABLET | Freq: Once | ORAL | Status: AC
  Administered 2022-09-27: 4 mg via ORAL
  Filled 2022-09-27: qty 1

## 2022-09-27 MED ORDER — OSELTAMIVIR PHOSPHATE 75 MG PO CAPS: 75.0000 mg | ORAL_CAPSULE | Freq: Two times a day (BID) | ORAL | 0 refills | Status: DC

## 2022-09-27 MED ORDER — OSELTAMIVIR PHOSPHATE 75 MG PO CAPS: 75.0000 mg | ORAL_CAPSULE | Freq: Two times a day (BID) | ORAL | 0 refills | Status: AC

## 2022-09-27 MED ORDER — IBUPROFEN 400 MG PO TABS
400.0000 mg | ORAL_TABLET | Freq: Once | ORAL | Status: AC
  Administered 2022-09-27: 400 mg via ORAL
  Filled 2022-09-27: qty 1

## 2022-09-27 NOTE — ED Triage Notes (Signed)
Arrives with C/O persistent cough since October.  Initially treated for bronchitis and symptoms improved, but returned one week later.  Seen through Urgent Care yesterday for same.  Dx with COPD exacerbation and given Augmentin and prednisone to take.  Arrives today AAOx3.  Skin warm and dry.  NAD.  Cough noted.  No SOB/ DOE.  NAD

## 2022-09-27 NOTE — ED Provider Notes (Signed)
[facility] Provider Note    Event Date/Time   First MD Initiated Contact with Patient 09/27/22 1310     (approximate)   History   Cough   HPI  Steven Hughes is a 46 y.o. male past medical history of hypertension and PVCs and COPD who presents because of cough congestion body aches fever.  Patient symptoms started about a week ago.  He endorses cough nasal congestion sore throat headache body aches feeling generally unwell.  Has had fever up to 103.  Notes that prior to this had been diagnosed with sinusitis in October and has had ongoing congestion since that time.  No significant GI symptoms.  Family members had COVID about a month ago.  He is having pain in the upper chest that is worse with coughing and does feel short of breath with it.  Patient was prescribed Augmentin and prednisone at urgent care.     Past Medical History:  Diagnosis Date   Anemia    ONLY AS A CHILD   Arrhythmia    Arthritis    Asthma    Complication of anesthesia    DURING DENTAL PROCEDURES NOVACAINE CAUSES MORE PVC'S    COPD (chronic obstructive pulmonary disease) (HCC)    DEVELOPED THIS 5 YEARS AFTER BEING [facility]   Dyspnea    PT STATES THIS WILL HAPPEN OCC WITH ENVIRONMENT (MOLD, DUST ETC) SINCE BEING [facility]-PT STATES HE CAN WALK 5 MILES WITHOUT GETTING SOB OR HAVING CP   Headache    MIGRAINES   Hypertension    PVC (premature ventricular contraction)     There are no problems to display for this patient.    Physical Exam  Triage Vital Signs: ED Triage Vitals  Enc Vitals Group     BP 09/27/22 1236 (!) 161/79     Pulse Rate 09/27/22 1236 (!) 119     Resp 09/27/22 1236 18     Temp 09/27/22 1236 100.3 F (37.9 C)     Temp src --      SpO2 09/27/22 1236 95 %     Weight 09/27/22 1006 178 lb 9.2 oz (81 kg)     Height 09/27/22 1006 5\' 10"  (1.778 m)     Head Circumference --      Peak Flow --      Pain Score 09/27/22 1006 0      Pain Loc --      Pain Edu? --      Excl. in GC? --     Most recent vital signs: Vitals:   09/27/22 1252 09/27/22 1433  BP: (!) 143/95 (!) 140/88  Pulse: (!) 105 100  Resp: 20 20  Temp:  99.9 F (37.7 C)  SpO2: 97% 98%     General: Awake, no distress.  CV:  Good peripheral perfusion.  Tachycardic Resp:  Normal effort.  Lungs are clear Abd:  No distention.  Neuro:             Awake, Alert, Oriented x 3  Other:     ED Results / Procedures / Treatments  Labs (all labs ordered are listed, but only abnormal results are displayed) Labs Reviewed  RESP PANEL BY RT-PCR (FLU A&B, COVID) ARPGX2 - Abnormal; Notable for the following components:      Result Value   Influenza B by PCR POSITIVE (*)    All other components within normal limits     EKG  EKG interpretation performed by myself: NSR, nml axis, nml intervals, no acute ischemic changes    RADIOLOGY I reviewed and interpreted the CXR which does not show any acute cardiopulmonary process    PROCEDURES:  Critical Care performed: No  Procedures  MEDICATIONS ORDERED IN ED: Medications  ibuprofen (ADVIL) tablet 400 mg (400 mg Oral Given 09/27/22 1421)  ondansetron (ZOFRAN-ODT) disintegrating tablet 4 mg (4 mg Oral Given 09/27/22 1421)     IMPRESSION / MDM / ASSESSMENT AND PLAN / ED COURSE  I reviewed the triage vital signs and the nursing notes.                              Patient's presentation is most consistent with acute, uncomplicated illness.  Differential diagnosis includes, but is not limited to, viral illness including COVID-19, influenza, bacterial pneumonia, pleurisy, pneumothorax, myocarditis/pericarditis  Is a 46 year old male presenting with cough congestion fevers body aches headache.  This illness started about a week ago has had fever high as 103 cough shortness of breath congestion sore throat feeling generally miserable.  Initially was seen at urgent care and given prednisone and Augmentin.   Patient is febrile to 100.3 here and tachycardic however he is satting well.  He looks nontoxic but looks like he does not feel well.  His lungs are clear looks well-hydrated.  Plan to give Motrin for fever control Zofran given his nausea and p.o. rehydrate.  Will get chest x-ray and EKG given his chest symptoms.  Of note he is influenza B positive which I think explains his symptoms.  Patient's chest x-ray is clear.  EKG is within normal limits.  Patient's heart rate has normalized.  Does have a low-grade temp of 99.9 still.  Suspect this was driving his tachycardia.  Overall clinical presentation is consistent with influenza.  Recommended supportive care.  Given COPD will prescribe Tamiflu.     FINAL CLINICAL IMPRESSION(S) / ED DIAGNOSES   Final diagnoses:  Influenza B     Rx / DC Orders   ED Discharge Orders          Ordered    oseltamivir (TAMIFLU) 75 MG capsule  2 times daily        09/27/22 1502             Note:  This document was prepared using Dragon voice recognition software and may include unintentional dictation errors.   Georga Hacking, MD 09/27/22 [phone]

## 2022-09-27 NOTE — Discharge Instructions (Addendum)
Have influenza.  Your chest x-ray did not show pneumonia.  You can take Motrin for body aches.  Please limit your Tylenol to 3 g total per day.  Because of your COPD you are at higher risk of developing severe illness from influenza so I am prescribing you the Tamiflu.  You can take this twice a day for the next 5 days.

## 2022-10-15 ENCOUNTER — Ambulatory Visit
Admission: RE | Admit: 2022-10-15 | Discharge: 2022-10-15 | Disposition: A | Discharge status: Home / Self Care | Payer: BC Managed Care – PPO | Attending: Family Medicine | Admitting: Family Medicine

## 2022-10-15 ENCOUNTER — Other Ambulatory Visit: Payer: Self-pay | Admitting: Family Medicine

## 2022-10-15 ENCOUNTER — Ambulatory Visit
Admission: RE | Admit: 2022-10-15 | Discharge: 2022-10-15 | Disposition: A | Discharge status: Home / Self Care | Payer: BC Managed Care – PPO | Source: Ambulatory Visit | Attending: Family Medicine | Admitting: Family Medicine

## 2022-10-15 DIAGNOSIS — R059 Cough, unspecified: Secondary | ICD-10-CM | POA: Insufficient documentation

## 2022-11-30 ENCOUNTER — Emergency Department
Admission: EM | Admit: 2022-11-30 | Discharge: 2022-11-30 | Disposition: A | Discharge status: Home / Self Care | Payer: BC Managed Care – PPO | Attending: Student in an Organized Health Care Education/Training Program | Admitting: Student in an Organized Health Care Education/Training Program

## 2022-11-30 ENCOUNTER — Emergency Department: Payer: BC Managed Care – PPO

## 2022-11-30 ENCOUNTER — Other Ambulatory Visit: Payer: Self-pay

## 2022-11-30 DIAGNOSIS — I10 Essential (primary) hypertension: Secondary | ICD-10-CM | POA: Diagnosis not present

## 2022-11-30 DIAGNOSIS — S060X0A Concussion without loss of consciousness, initial encounter: Secondary | ICD-10-CM | POA: Diagnosis not present

## 2022-11-30 DIAGNOSIS — J45909 Unspecified asthma, uncomplicated: Secondary | ICD-10-CM | POA: Diagnosis not present

## 2022-11-30 DIAGNOSIS — Y92410 Unspecified street and highway as the place of occurrence of the external cause: Secondary | ICD-10-CM | POA: Diagnosis not present

## 2022-11-30 DIAGNOSIS — J449 Chronic obstructive pulmonary disease, unspecified: Secondary | ICD-10-CM | POA: Diagnosis not present

## 2022-11-30 DIAGNOSIS — S161XXA Strain of muscle, fascia and tendon at neck level, initial encounter: Secondary | ICD-10-CM | POA: Insufficient documentation

## 2022-11-30 DIAGNOSIS — S0990XA Unspecified injury of head, initial encounter: Secondary | ICD-10-CM | POA: Diagnosis present

## 2022-11-30 MED ORDER — ONDANSETRON 4 MG PO TBDP: 4.0000 mg | ORAL_TABLET | Freq: Three times a day (TID) | ORAL | 0 refills | Status: AC | PRN
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
## 2022-12-10 ENCOUNTER — Encounter: Payer: Self-pay | Admitting: Family Medicine

## 2022-12-20 ENCOUNTER — Encounter: Payer: Self-pay | Admitting: *Deleted

## 2023-05-22 ENCOUNTER — Emergency Department
Admission: EM | Admit: 2023-05-22 | Discharge: 2023-05-22 | Disposition: A | Discharge status: Home / Self Care | Payer: BC Managed Care – PPO | Attending: Emergency Medicine | Admitting: Emergency Medicine

## 2023-05-22 ENCOUNTER — Other Ambulatory Visit: Payer: Self-pay

## 2023-05-22 ENCOUNTER — Emergency Department: Payer: BC Managed Care – PPO

## 2023-05-22 DIAGNOSIS — R509 Fever, unspecified: Secondary | ICD-10-CM | POA: Diagnosis present

## 2023-05-22 DIAGNOSIS — J449 Chronic obstructive pulmonary disease, unspecified: Secondary | ICD-10-CM | POA: Diagnosis not present

## 2023-05-22 DIAGNOSIS — U071 COVID-19: Secondary | ICD-10-CM | POA: Insufficient documentation

## 2023-05-22 LAB — COMPREHENSIVE METABOLIC PANEL
ALT: 32 U/L (ref 0–44)
AST: 40 U/L (ref 15–41)
Albumin: 4 g/dL (ref 3.5–5.0)
Alkaline Phosphatase: 60 U/L (ref 38–126)
Anion gap: 9 (ref 5–15)
BUN: 10 mg/dL (ref 6–20)
CO2: 25 mmol/L (ref 22–32)
Calcium: 8.3 mg/dL — ABNORMAL LOW (ref 8.9–10.3)
Chloride: 101 mmol/L (ref 98–111)
Creatinine, Ser: 1.11 mg/dL (ref 0.61–1.24)
GFR, Estimated: >60 mL/min (ref >60)
Glucose, Bld: 154 mg/dL — ABNORMAL HIGH (ref 70–99)
Potassium: 3.8 mmol/L (ref 3.5–5.1)
Sodium: 135 mmol/L (ref 135–145)
Total Bilirubin: 0.8 mg/dL (ref 0.3–1.2)
Total Protein: 6.9 g/dL (ref 6.5–8.1)

## 2023-05-22 LAB — CBC WITH DIFFERENTIAL/PLATELET
Abs Immature Granulocytes: 0.01 10*3/uL (ref 0.00–0.07)
Basophils Absolute: 0 10*3/uL (ref 0.0–0.1)
Basophils Relative: 0 %
Eosinophils Absolute: 0.1 10*3/uL (ref 0.0–0.5)
Eosinophils Relative: 1 %
HCT: 40.5 % (ref 39.0–52.0)
Hemoglobin: 13.8 g/dL (ref 13.0–17.0)
Immature Granulocytes: 0 %
Lymphocytes Relative: 10 %
Lymphs Abs: 0.7 10*3/uL (ref 0.7–4.0)
MCH: 31.4 pg (ref 26.0–34.0)
MCHC: 34.1 g/dL (ref 30.0–36.0)
MCV: 92 fL (ref 80.0–100.0)
Monocytes Absolute: 0.8 10*3/uL (ref 0.1–1.0)
Monocytes Relative: 12 %
Neutro Abs: 4.7 10*3/uL (ref 1.7–7.7)
Neutrophils Relative %: 77 %
Platelets: 217 10*3/uL (ref 150–400)
RBC: 4.4 MIL/uL (ref 4.22–5.81)
RDW: 12.1 % (ref 11.5–15.5)
WBC: 6.2 10*3/uL (ref 4.0–10.5)
nRBC: 0 % (ref 0.0–0.2)

## 2023-05-22 LAB — URINALYSIS, ROUTINE W REFLEX MICROSCOPIC
Bilirubin Urine: NEGATIVE
Glucose, UA: NEGATIVE mg/dL
Hgb urine dipstick: NEGATIVE
Ketones, ur: NEGATIVE mg/dL
Leukocytes,Ua: NEGATIVE
Nitrite: NEGATIVE
Protein, ur: NEGATIVE mg/dL
Specific Gravity, Urine: 1.002 — ABNORMAL LOW (ref 1.005–1.030)
pH: 7 (ref 5.0–8.0)

## 2023-05-22 LAB — LACTIC ACID, PLASMA: Lactic Acid, Venous: 1.7 mmol/L (ref 0.5–1.9)

## 2023-05-22 LAB — TROPONIN I (HIGH SENSITIVITY): Troponin I (High Sensitivity): 6 ng/L (ref <18)

## 2023-05-22 LAB — SARS CORONAVIRUS 2 BY RT PCR: SARS Coronavirus 2 by RT PCR: POSITIVE — AB

## 2023-05-22 MED ORDER — ACETAMINOPHEN 325 MG PO TABS
650.0000 mg | ORAL_TABLET | Freq: Once | ORAL | Status: AC
  Administered 2023-05-22: 650 mg via ORAL
  Filled 2023-05-22: qty 2

## 2023-05-22 MED ORDER — SODIUM CHLORIDE 0.9 % IV BOLUS: 1000.0000 mL | Freq: Once | INTRAVENOUS | Status: DC
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
## 2023-05-22 NOTE — Discharge Instructions (Addendum)
You are seen in the emergency department and tested positive for COVID.  Your chest x-ray did not show any signs of pneumonia.  Is importantly stay hydrated and drink plenty of fluids.  Alternate Motrin and Tylenol for fever control.  Return to the emergency department for any shortness of breath or worsening symptoms.  Pain/fever control:  Ibuprofen (motrin/aleve/advil) - You can take 3 tablets (600 mg) every 6 hours as needed for pain/fever.  Acetaminophen (tylenol) - You can take 2 extra strength tablets (1000 mg) every 6 hours as needed for pain/fever.  You can alternate these medications or take them together.  Make sure you eat food/drink water when taking these medications.

## 2023-05-22 NOTE — ED Provider Notes (Addendum)
[facility] Provider Note    Event Date/Time   First MD Initiated Contact with Patient 05/22/23 1909     (approximate)   History   Fever and Generalized Body Aches   HPI  Steven Hughes is a 47 y.o. male past medical history significant for COPD, presents to the emergency department with fever.  Started to feel unwell on Thursday night and then started to have a fever on Thursday.  Complaining of bodyaches.  Cough and shortness of breath.  Also complaining of some chest pain that has been intermittent.  Left-sided chest pain.  States that he does not have any active chest pain at this time.  No history of DVT or PE.  No active tobacco use no history of tobacco use.  Children at home with similar symptoms.  Denies any nausea, vomiting, abdominal pain or diarrhea.  States that he intermittently has episodes of burning with urination.  Normal urination today.     Physical Exam   Triage Vital Signs: ED Triage Vitals  Encounter Vitals Group     BP 05/22/23 1817 128/76     Systolic BP Percentile --      Diastolic BP Percentile --      Pulse Rate 05/22/23 1817 (!) 124     Resp 05/22/23 1817 20     Temp 05/22/23 1817 99.7 F (37.6 C)     Temp Source 05/22/23 1817 Oral     SpO2 05/22/23 1817 95 %     Weight 05/22/23 1818 199 lb 15.3 oz (90.7 kg)     Height 05/22/23 1818 5\' 10"  (1.778 m)     Head Circumference --      Peak Flow --      Pain Score 05/22/23 1818 10     Pain Loc --      Pain Education --      Exclude from Growth Chart --     Most recent vital signs: Vitals:   05/22/23 1817 05/22/23 1936  BP: 128/76 131/73  Pulse: (!) 124 90  Resp: 20 18  Temp: 99.7 F (37.6 C) (!) 100.5 F (38.1 C)  SpO2: 95% 98%    Physical Exam Constitutional:      Appearance: He is well-developed.  HENT:     Head: Atraumatic.  Eyes:     Conjunctiva/sclera: Conjunctivae normal.     Pupils: Pupils are equal, round, and reactive to light.  Cardiovascular:      Rate and Rhythm: Regular rhythm.  Pulmonary:     Effort: No respiratory distress.     Breath sounds: No wheezing.  Abdominal:     Tenderness: There is no abdominal tenderness.  Musculoskeletal:        General: Normal range of motion.     Cervical back: Normal range of motion.     Right lower leg: No edema.     Left lower leg: No edema.  Skin:    General: Skin is warm.     Capillary Refill: Capillary refill takes less than 2 seconds.  Neurological:     Mental Status: He is alert. Mental status is at baseline.  Psychiatric:        Mood and Affect: Mood normal.     IMPRESSION / MDM / ASSESSMENT AND PLAN / ED COURSE  I reviewed the triage vital signs and the nursing notes.  On arrival patient was afebrile but tachycardic.    Repeat vital signs on my exam patient febrile to 100.5,  no longer tachycardic, normal respirations and SpO2 is 98%  Differential diagnosis including COVID, ACS, pneumonia, viral illness, dehydration, urinary tract infection  EKG  I, Corena Herter, the attending physician, personally viewed and interpreted this ECG.   Rate: Normal  Rhythm: Normal sinus  Axis: Normal  Intervals: Normal  ST&T Change: None  No tachycardic or bradycardic dysrhythmias while on cardiac telemetry.  RADIOLOGY my interpretation of imaging: No signs of pneumonia  LABS (all labs ordered are listed, but only abnormal results are displayed) Labs interpreted as -    Labs Reviewed  SARS CORONAVIRUS 2 BY RT PCR - Abnormal; Notable for the following components:      Result Value   SARS Coronavirus 2 by RT PCR POSITIVE (*)    All other components within normal limits  COMPREHENSIVE METABOLIC PANEL - Abnormal; Notable for the following components:   Glucose, Bld 154 (*)    Calcium 8.3 (*)    All other components within normal limits  URINALYSIS, ROUTINE W REFLEX MICROSCOPIC - Abnormal; Notable for the following components:   Color, Urine COLORLESS (*)    APPearance CLEAR  (*)    Specific Gravity, Urine 1.002 (*)    All other components within normal limits  LACTIC ACID, PLASMA  CBC WITH DIFFERENTIAL/PLATELET  TROPONIN I (HIGH SENSITIVITY)     MDM  Repeat vital signs reassuring.  Patient's COVID test is positive.  UA without signs of urinary tract infection.  Troponin is negative, have a low suspicion for ACS.  Low suspicion for pulmonary embolism more likely having chest pain secondary to his cough and COVID.  Low risk Wells criteria.  Able to tolerate p.o. in the emergency department.  Discussed symptomatic treatment with Motrin and Tylenol and quarantine.  Given return precautions to the emergency department for worsening symptoms and discussed follow-up with her primary care provider.     PROCEDURES:  Critical Care performed: No  Procedures  Patient's presentation is most consistent with acute presentation with potential threat to life or bodily function.   MEDICATIONS ORDERED IN ED: Medications  acetaminophen (TYLENOL) tablet 650 mg (650 mg Oral Given 05/22/23 1945)    FINAL CLINICAL IMPRESSION(S) / ED DIAGNOSES   Final diagnoses:  COVID  Fever, unspecified fever cause     Rx / DC Orders   ED Discharge Orders     None        Note:  This document was prepared using Dragon voice recognition software and may include unintentional dictation errors.   Corena Herter, MD 05/22/23 Babette Relic    Corena Herter, MD 05/22/23 2015

## 2023-07-04 DIAGNOSIS — S46011A Strain of muscle(s) and tendon(s) of the rotator cuff of right shoulder, initial encounter: Secondary | ICD-10-CM | POA: Diagnosis not present

## 2023-10-21 DIAGNOSIS — J45909 Unspecified asthma, uncomplicated: Secondary | ICD-10-CM | POA: Diagnosis not present

## 2023-10-21 DIAGNOSIS — L309 Dermatitis, unspecified: Secondary | ICD-10-CM | POA: Diagnosis not present

## 2023-10-21 DIAGNOSIS — Z7951 Long term (current) use of inhaled steroids: Secondary | ICD-10-CM | POA: Diagnosis not present

## 2023-10-21 DIAGNOSIS — I10 Essential (primary) hypertension: Secondary | ICD-10-CM | POA: Diagnosis not present

## 2024-08-08 ENCOUNTER — Ambulatory Visit
Admission: RE | Admit: 2024-08-08 | Discharge: 2024-08-08 | Disposition: A | Discharge status: Home / Self Care | Attending: Family Medicine | Admitting: Family Medicine

## 2024-08-08 ENCOUNTER — Other Ambulatory Visit: Payer: Self-pay | Admitting: Family Medicine

## 2024-08-08 ENCOUNTER — Ambulatory Visit
Admission: RE | Admit: 2024-08-08 | Discharge: 2024-08-08 | Disposition: A | Discharge status: Home / Self Care | Source: Ambulatory Visit | Attending: Family Medicine | Admitting: Family Medicine

## 2024-08-08 DIAGNOSIS — R0789 Other chest pain: Secondary | ICD-10-CM
